# Patient Record
Sex: Female | Born: 1965 | Race: White | Hispanic: No | Marital: Married | Smoking: Never smoker
Health system: Southern US, Community
[De-identification: ages and names within clinical notes are randomized; demographics above are authoritative.]

## PROBLEM LIST (undated history)

## (undated) DIAGNOSIS — K219 Gastro-esophageal reflux disease without esophagitis: Secondary | ICD-10-CM

## (undated) DIAGNOSIS — R51 Headache: Secondary | ICD-10-CM

## (undated) DIAGNOSIS — G4730 Sleep apnea, unspecified: Secondary | ICD-10-CM

## (undated) DIAGNOSIS — R519 Headache, unspecified: Secondary | ICD-10-CM

## (undated) HISTORY — PX: DIAGNOSTIC LAPAROSCOPY: SUR761

## (undated) HISTORY — PX: COLONOSCOPY: SHX174

## (undated) HISTORY — PX: ABDOMINAL HYSTERECTOMY: SHX81

---

## 1997-11-03 ENCOUNTER — Inpatient Hospital Stay (HOSPITAL_COMMUNITY): Admission: AD | Admit: 1997-11-03 | Discharge: 1997-11-03 | Payer: Self-pay | Admitting: Obstetrics and Gynecology

## 1998-02-25 ENCOUNTER — Inpatient Hospital Stay (HOSPITAL_COMMUNITY): Admission: AD | Admit: 1998-02-25 | Discharge: 1998-02-27 | Payer: Self-pay | Admitting: Obstetrics & Gynecology

## 1999-04-27 ENCOUNTER — Other Ambulatory Visit: Admission: RE | Admit: 1999-04-27 | Discharge: 1999-04-27 | Payer: Self-pay | Admitting: Obstetrics & Gynecology

## 2000-05-31 ENCOUNTER — Other Ambulatory Visit: Admission: RE | Admit: 2000-05-31 | Discharge: 2000-05-31 | Payer: Self-pay | Admitting: Obstetrics & Gynecology

## 2001-06-26 ENCOUNTER — Other Ambulatory Visit: Admission: RE | Admit: 2001-06-26 | Discharge: 2001-06-26 | Payer: Self-pay | Admitting: Obstetrics & Gynecology

## 2001-09-12 ENCOUNTER — Encounter: Admission: RE | Admit: 2001-09-12 | Discharge: 2001-09-12 | Payer: Self-pay | Admitting: Obstetrics & Gynecology

## 2001-09-12 ENCOUNTER — Encounter: Payer: Self-pay | Admitting: Obstetrics & Gynecology

## 2001-09-29 ENCOUNTER — Encounter: Admission: RE | Admit: 2001-09-29 | Discharge: 2001-12-28 | Payer: Self-pay | Admitting: Family Medicine

## 2002-08-03 ENCOUNTER — Other Ambulatory Visit: Admission: RE | Admit: 2002-08-03 | Discharge: 2002-08-03 | Payer: Self-pay | Admitting: Obstetrics & Gynecology

## 2002-08-14 ENCOUNTER — Encounter: Admission: RE | Admit: 2002-08-14 | Discharge: 2002-08-14 | Payer: Self-pay | Admitting: Family Medicine

## 2002-08-14 ENCOUNTER — Encounter: Payer: Self-pay | Admitting: Family Medicine

## 2003-08-19 ENCOUNTER — Other Ambulatory Visit: Admission: RE | Admit: 2003-08-19 | Discharge: 2003-08-19 | Payer: Self-pay | Admitting: Obstetrics & Gynecology

## 2004-02-19 ENCOUNTER — Encounter: Admission: RE | Admit: 2004-02-19 | Discharge: 2004-02-19 | Payer: Self-pay | Admitting: Gastroenterology

## 2004-09-13 HISTORY — PX: SHOULDER SURGERY: SHX246

## 2004-09-29 ENCOUNTER — Other Ambulatory Visit: Admission: RE | Admit: 2004-09-29 | Discharge: 2004-09-29 | Payer: Self-pay | Admitting: Obstetrics & Gynecology

## 2005-10-26 ENCOUNTER — Other Ambulatory Visit: Admission: RE | Admit: 2005-10-26 | Discharge: 2005-10-26 | Payer: Self-pay | Admitting: Obstetrics & Gynecology

## 2006-07-17 ENCOUNTER — Encounter: Admission: RE | Admit: 2006-07-17 | Discharge: 2006-07-17 | Payer: Self-pay | Admitting: Family Medicine

## 2007-05-25 ENCOUNTER — Ambulatory Visit (HOSPITAL_COMMUNITY): Admission: RE | Admit: 2007-05-25 | Discharge: 2007-05-26 | Payer: Self-pay | Admitting: Obstetrics & Gynecology

## 2007-05-25 ENCOUNTER — Encounter (INDEPENDENT_AMBULATORY_CARE_PROVIDER_SITE_OTHER): Payer: Self-pay | Admitting: Obstetrics & Gynecology

## 2007-07-16 ENCOUNTER — Encounter: Admission: RE | Admit: 2007-07-16 | Discharge: 2007-07-16 | Payer: Self-pay | Admitting: Neurosurgery

## 2007-11-11 ENCOUNTER — Encounter: Admission: RE | Admit: 2007-11-11 | Discharge: 2007-11-11 | Payer: Self-pay | Admitting: Neurosurgery

## 2008-06-20 ENCOUNTER — Encounter: Admission: RE | Admit: 2008-06-20 | Discharge: 2008-06-20 | Payer: Self-pay | Admitting: Gastroenterology

## 2008-08-20 ENCOUNTER — Encounter: Admission: RE | Admit: 2008-08-20 | Discharge: 2008-08-20 | Payer: Self-pay | Admitting: Gastroenterology

## 2010-10-04 ENCOUNTER — Encounter: Payer: Self-pay | Admitting: Gastroenterology

## 2010-10-14 ENCOUNTER — Encounter: Payer: Self-pay | Admitting: Orthopedic Surgery

## 2011-01-26 NOTE — Op Note (Signed)
Nicole [name], Nicole [name] NO.:  [id number]   MEDICAL RECORD NO.:  [id number]          PATIENT TYPE:  AMB   LOCATION:  SDC                           FACILITY:  [facility]   PHYSICIAN:  Freddy Finner, M.D.   DATE OF BIRTH:  Nov 10, 1965   DATE OF PROCEDURE:  05/25/2007  DATE OF DISCHARGE:                               OPERATIVE REPORT   PREOPERATIVE DIAGNOSES:  1. Uterine fibroids.  2. Complex left ovarian cysts.  3. Left paratubal cyst.   POSTOPERATIVE DIAGNOSES:  1. Uterine fibroids.  2. Complex left ovarian cysts.  3. Left paratubal cyst.  4. Benign-appearing cystic left ovary.   PROCEDURE:  Laparoscopically-assisted vaginal hysterectomy, left  salpingo-oophorectomy.   SURGEON:  Varney Baas, MD.   Threasa HeadsMilton Ferguson.   ANESTHESIA:  General.   ESTIMATED BLOOD LOSS:  Estimated intraoperative blood loss 150 mL.   INTRAOPERATIVE COMPLICATIONS:  None.   HISTORY OF PRESENT ILLNESS:  Details of the present illness are recorded  in the admission note. The patient was admitted on the morning of  surgery.   PROCEDURE IN DETAIL:  She was brought to the operating room after  receiving a gram of Ancef IV and being placed in serial compression hose  on her lower extremities. She was placed under adequate general  endotracheal anesthesia, placed in the dorsal lithotomy position using  the [employer] system. Betadine prep of the abdomen, perineum and  vagina was carried out in the standard fashion. Bladder was evacuated  with sterile technique using a Foley catheter.   Hulka tenaculum was attached to the cervix under direct visualization.  Sterile drapes were applied. Two small incisions were made, one at the  umbilicus, one just above the symphysis. An 11-mm blade and disposable  trocar were introduced in the umbilicus while elevating the anterior  abdominal wall manually. Direct inspection revealed adequate placement  with no evidence of injury on entry.  Pneumoperitoneum was allowed to  accumulate with carbon dioxide gas. A 5-mm trocar was placed through a  lower incision and placed just above the symphysis in the midline.  Through it a spring-loaded grasping forceps was used.   Systematic examination of the pelvic and abdominal contents was carried  out; photographs were made. The liver appeared to be normal, appendix  was normal. No apparent abnormalities were noted in the upper abdomen.  The uterus was enlarged. Right tube and ovary were normal. Left tube was  normal except there was a paratubal 1 to 1.5 cm cyst. The left ovary was  cystically enlarged but there were no external excrescences. There was  no fixation, no pelvic adhesions of any kind.   Using the Gyrus Tripolar device, the dissection was begun. The right  utero-ovarian ligament was sealed and divided. The fallopian tube was  sealed and divided. The round ligament was sealed and divided. The upper  broad ligament was sealed and divided. This was carried down to a level  just above the uterine arteries. On the left side the infundibulopelvic  ligament was developed with the same technique with progressive bites,  as well as the round ligament and upper broad ligament. Again, the  dissection was carried down to a level just above the uterine arteries.   Attention was then turned vaginally. Posterior weighted vaginal  retractor was placed. A Hulka tenaculum was replaced with a Jacobs  tenaculum. Deaver retractors were used anteriorly and laterally for  exposure. The mucosa posterior to the cervix was tented and the cul-de-  sac entered with sharp dissection with Mayo scissors. The cervix was  circumscribed with a scalpel to releas the mucosa. Uterosacral pedicles  were then sealed and divided with a LigaSure device. Bladder pillars  were taken separately, sealed and divided. The bladder was carefully  advanced off the cervix and lower segment. Anterior peritoneum was   entered without difficulty. LigaSure was then used to seal and divide  the cardinal ligament pedicles, the uterine artery pedicles and the  pedicle above the uterine arteries on each side. The uterus was  delivered through the introitus. Remaining peritoneal band on the right  side was then sealed and divided. Moist tapes were placed to pack the  bowel away from the cuff. The anchors of the vagina were anchored to the  uterosacrals with a mattress suture of 0 Monocryl. Uterosacrals were  plicated and posterior peritoneum closed with an interrupted 0 Monocryl  suture. Cuff was closed vertically with figure-of-eights of 0 Monocryl.   Foley catheter was placed. Reinspection abdominally was then carried  out. Then Nezhat irrigation system was used and irrigation of the pelvis  was carried out. Hemostasis was complete. This was confirmed under  irrigation and under reduced intra-abdominal pressure. The irrigating  solution was aspirated from the abdomen , gas was allowed to escape from  the abdomen, instruments were removed.   The skin incisions were anesthetized with quarter-percent plain  Marcaine. Incisions were closed with interrupted subcuticular sutures of  3-0 Dexon. Steri-Strips were applied to the lower incision. Sterile  dressing was placed at the umbilicus.   The patient was awakened, taken to recovery in good condition.      Freddy Finner, M.D.  Electronically Signed     WRN/MEDQ  D:  05/25/2007  T:  05/25/2007  Job:  [id number]

## 2011-01-26 NOTE — H&P (Signed)
Nicole [name], SEEL NO.:  [id number]   MEDICAL RECORD NO.:  [id number]          PATIENT TYPE:  AMB   LOCATION:  SDC                           FACILITY:  [facility]   PHYSICIAN:  Freddy Finner, M.D.   DATE OF BIRTH:  Jul 20, 1966   DATE OF ADMISSION:  DATE OF DISCHARGE:                              HISTORY & PHYSICAL   ADMITTING DIAGNOSES:  1. Persistent left adnexal mass.  2. Uterine leiomyomata.  3. Chronic pelvic pain, particularly left pelvic pain.  4. Dysfunctional uterine bleeding.   HISTORY OF PRESENT ILLNESS:  The patient is a 45 year old white married  female, gravida 2, para 2 who has had an ongoing problem with  dysfunctional uterine bleeding at least over the last 6 months. On  serial ultrasounds she has been shown to have a persistent cystic left  adnexal mass, possibly consistent with an endometrioma as well as a left  paraovarian mass which is cystic. She does have intramural leiomyomata.  She has used various modalities to control dysfunctional bleeding and  pain including oral contraceptives which she did not tolerate well and  an intrauterine device which also was not tolerated well and really  never produced adequate control of bleeding. Options of therapy have  been discussed with her and she has elected to proceed with a  laparoscopically-assisted vaginal hysterectomy and probably left  salpingo-oophorectomy.   REVIEW OF SYSTEMS:  Her current review of systems is otherwise negative.  There are no cardiopulmonary, GI or other GU conditions or symptoms.   PAST MEDICAL HISTORY:  The patient has no known significant medical  illnesses except adult-onset mild asthma which she treats with oral  inhalers before exercising immunity. She has some GERD symptoms for  which she takes Nexium. She has had 2 uncomplicated vaginal deliveries.   ALLERGIES:  SHE HAS NO KNOWN DRUG ALLERGIES.   SOCIAL HISTORY:  She is not a cigarette smoker.   FAMILY HISTORY:   Noncontributory.   PHYSICAL EXAMINATION:  HEENT: Grossly within normal limits.  NECK: The thyroid gland is not palpably enlarged.  VITAL SIGNS: Blood pressure in the office 102/68.  CHEST: The chest is clear to auscultation.  HEART: Normal sinus rhythm. There is a grade 2/6 early systolic murmur  most consistent with a flay murmur.  BREAST: Exam is considered to be normal. No nipple change, no skin  change, no palpable masses, no nipple discharge. (Mammogram in April of  2008, was normal).  ABDOMEN: The abdomen is soft. There is mild left lower abdominal  tenderness. No appreciable hepatosplenomegaly. No CVA tenderness.  PELVIC: On exam, external genitalia, vagina and cervix are normal.  Bimanual reveals tenderness in the left adnexa. The uterus is slightly  enlarged. There is a left adnexal fullness but no definite palpable mass  clinically.  RECTUM: The rectum is palpably normal. Rectovaginal exam confirmed these  findings.  EXTREMITIES: Without clubbing, cyanosis or edema.   ASSESSMENT:  Uterine leiomyomata, persistent left ovarian mass most  consistent with an endometrioma, dysfunctional uterine bleeding, chronic  left pelvic pain.   PLAN:  Laparoscopically-assisted  vaginal hysterectomy, left salpingo-  oophorectomy probable, possible bilateral salpingo-oophorectomy. The  potential risks of the procedure, including infection, injury to other  organs, hemorrhage have been discussed with the patient. She is now  admitted and prepared to proceed with surgery.      Freddy Finner, M.D.  Electronically Signed     WRN/MEDQ  D:  05/24/2007  T:  05/24/2007  Job:  [id number]

## 2011-01-29 NOTE — Discharge Summary (Signed)
NAMECLESSIE, Nicole [name] NO.:  [id number]   MEDICAL RECORD NO.:  [id number]          PATIENT TYPE:  OIB   LOCATION:  9313                          FACILITY:  [facility]   PHYSICIAN:  Freddy Finner, M.D.   DATE OF BIRTH:  Feb 17, 1966   DATE OF ADMISSION:  05/25/2007  DATE OF DISCHARGE:  05/26/2007                               DISCHARGE SUMMARY   DISCHARGE DIAGNOSES:  1. Chronic left pelvic pain.  2. Uterine leiomyomata.  3. Persistent left adnexal mass.  4. Dysfunctional uterine bleeding.   OPERATIVE PROCEDURES:  1. Laparoscopically assisted vaginal hysterectomy.  2. Left salpingo-oophorectomy.   HISTOLOGIC DIAGNOSES:  1. Uterine leiomyomata.  2. Benign left paratubal cyst.   DISPOSITION:  The patient was in satisfactory improved condition at that  time of her discharge.  She was having adequate bowel and bladder  function.  She was ambulating without difficulty.  She was discharged  home on Percocet 10/500 one every 4-6 hours as needed for postoperative  pain.  She is to continue multivitamins.  She is to use ibuprofen.  She  is to avoid vaginal entry.  She is to call for fever or heavy vaginal  bleeding.  She is return to the office in approximately 2 weeks for  first postoperative visit.  She is to take a regular diet.   Details of present illness, past history, family history, review of  systems and physical exam recorded in the admission note.   The physical findings on admission of note are enlargement of the uterus  and ultrasound evidence of persistent cystic left adnexal mass.   LABORATORY DATA DURING THIS ADMISSION:  Includes normal CBC on admission  with hemoglobin 14.1, postoperative hemoglobin was 10.5.  admission  prothrombin time and PTT were normal.   HOSPITAL COURSE:  The patient was admitted on the morning of surgery.  She was treated perioperatively with serial compression hose for her  lower extremities.  She was given a bolus of Ancef  preoperatively.  The  above-described surgical procedure was accomplished on the morning of  admission without difficulty or intraoperative  complications.  By the first postoperative day, she had remained  afebrile.  Her condition was considered to be satisfactory.  Her  abdominal incisions were clean, dry and intact.  She was having scant  vaginal drainage.  She was considered to be in good condition and was  discharged home with further disposition as noted above.      Freddy Finner, M.D.  Electronically Signed     WRN/MEDQ  D:  06/22/2007  T:  06/22/2007  Job:  [id number]

## 2011-03-11 ENCOUNTER — Other Ambulatory Visit: Payer: Self-pay | Admitting: Obstetrics & Gynecology

## 2011-03-11 DIAGNOSIS — Z803 Family history of malignant neoplasm of breast: Secondary | ICD-10-CM

## 2011-03-30 ENCOUNTER — Other Ambulatory Visit: Payer: Self-pay

## 2011-06-25 LAB — CBC
HCT: 29.8 — ABNORMAL LOW
HCT: 40.3
Hemoglobin: 10.5 — ABNORMAL LOW
Hemoglobin: 14.1
MCHC: 35
MCHC: 35.1
MCV: 90.6
MCV: 91.2
Platelets: 206
Platelets: 271
RBC: 3.27 — ABNORMAL LOW
RBC: 4.46
RDW: 11.5
RDW: 11.9
WBC: 6.3
WBC: 8.6

## 2011-06-25 LAB — APTT: aPTT: 26

## 2011-06-25 LAB — PROTIME-INR
INR: 1
Prothrombin Time: 12.9

## 2012-05-26 ENCOUNTER — Other Ambulatory Visit: Payer: Self-pay | Admitting: Dermatology

## 2012-10-02 ENCOUNTER — Other Ambulatory Visit: Payer: Self-pay | Admitting: Neurosurgery

## 2012-10-02 DIAGNOSIS — M542 Cervicalgia: Secondary | ICD-10-CM

## 2012-10-03 ENCOUNTER — Ambulatory Visit
Admission: RE | Admit: 2012-10-03 | Discharge: 2012-10-03 | Disposition: A | Discharge status: Home / Self Care | Payer: BC Managed Care – PPO | Source: Ambulatory Visit | Attending: Neurosurgery | Admitting: Neurosurgery

## 2012-10-03 DIAGNOSIS — M542 Cervicalgia: Secondary | ICD-10-CM

## 2013-05-10 ENCOUNTER — Encounter (HOSPITAL_COMMUNITY): Payer: Self-pay | Admitting: Emergency Medicine

## 2013-05-10 ENCOUNTER — Emergency Department (HOSPITAL_COMMUNITY)
Admission: EM | Admit: 2013-05-10 | Discharge: 2013-05-10 | Disposition: A | Discharge status: Home / Self Care | Payer: BC Managed Care – PPO | Source: Home / Self Care

## 2013-05-10 DIAGNOSIS — T63461A Toxic effect of venom of wasps, accidental (unintentional), initial encounter: Secondary | ICD-10-CM

## 2013-05-10 DIAGNOSIS — T6391XA Toxic effect of contact with unspecified venomous animal, accidental (unintentional), initial encounter: Secondary | ICD-10-CM

## 2013-05-10 MED ORDER — TRIAMCINOLONE ACETONIDE 40 MG/ML IJ SUSP
INTRAMUSCULAR | Status: AC
  Filled 2013-05-10: qty 1

## 2013-05-10 MED ORDER — TRIAMCINOLONE ACETONIDE 40 MG/ML IJ SUSP
40.0000 mg | Freq: Once | INTRAMUSCULAR | Status: AC
  Administered 2013-05-10: 40 mg via INTRAMUSCULAR

## 2013-05-10 NOTE — ED Notes (Signed)
C/o bee sting and having an allergic reaction.  Patient states she gets the red streaks that spread out with swelling.

## 2013-05-10 NOTE — ED Provider Notes (Signed)
CSN: [id number]     Arrival date & time 05/10/13  1102 History   First MD Initiated Contact with Patient 05/10/13 1137     Chief Complaint  Patient presents with  . Insect Bite   (Consider location/radiation/quality/duration/timing/severity/associated sxs/prior Treatment) HPI Comments: 47 year old female states she was stung by a bee to the medial aspect of her right ankle at 8 PM last night. This produced approximately 4 cm area of minor erythema surrounding a pinpoint center created by the sting. Denies much itching or pain. She states every time she gets stains approximately 24 hours later she develops a reaction that is described by red streaks ascending the extremity. She is requesting a prednisone shot. Denies systemic symptoms.   History reviewed. No pertinent past medical history. No past surgical history on file. No family history on file. History  Substance Use Topics  . Smoking status: Not on file  . Smokeless tobacco: Not on file  . Alcohol Use: Not on file   OB History   Grav Para Term Preterm Abortions TAB SAB Ect Mult Living                 Review of Systems  Skin:       As per history of present illness  All other systems reviewed and are negative.    Allergies  Bee venom  Home Medications  No current outpatient prescriptions on file. BP 110/73  Pulse 66  Temp(Src) 98.2 F (36.8 C) (Oral)  Resp 16  SpO2 98% Physical Exam  Nursing note and vitals reviewed. Constitutional: She is oriented to person, place, and time. She appears well-developed and well-nourished. No distress.  HENT:  Mouth/Throat: Oropharynx is clear and moist. No oropharyngeal exudate.  Eyes: Conjunctivae and EOM are normal.  Neck: Normal range of motion. Neck supple.  Cardiovascular: Normal rate, regular rhythm and normal heart sounds.   Pulmonary/Chest: Effort normal and breath sounds normal. No respiratory distress. She has no wheezes. She has no rales.  Musculoskeletal: She  exhibits no edema.  Lymphadenopathy:    She has no cervical adenopathy.  Neurological: She is alert and oriented to person, place, and time. She exhibits normal muscle tone.  Skin: Skin is warm and dry.  4-5 cm diameter of light erythema to the medial aspect of the right ankle. No lymphangitis, streaking or associated rash. no edema.   Psychiatric: She has a normal mood and affect.    ED Course  Procedures (including critical care time) Labs Review Labs Reviewed - No data to display Imaging Review No results found.  MDM   1. Bee sting, initial encounter    There is a minimal local skin reaction to a bee sting to the right medial ankle. No systemic symptoms. The patient is requesting a Kenalog injection to ward off any allergic reaction that may occur in 24 hours after the sting. Kenalog 40 mg IM. Instructions for bee stings and allergic reaction.    Hayden Rasmussen, NP 05/10/13 [phone]

## 2013-05-12 NOTE — ED Provider Notes (Signed)
Medical screening examination/treatment/procedure(s) were performed by a resident physician or non-physician practitioner and as the supervising physician I was immediately available for consultation/collaboration.  Anzal Bartnick, MD   Ulani Degrasse S Aryona Sill, MD 05/12/13 0756 

## 2013-11-09 ENCOUNTER — Other Ambulatory Visit: Payer: Self-pay | Admitting: Obstetrics & Gynecology

## 2013-11-09 DIAGNOSIS — N6310 Unspecified lump in the right breast, unspecified quadrant: Secondary | ICD-10-CM

## 2013-11-14 ENCOUNTER — Ambulatory Visit
Admission: RE | Admit: 2013-11-14 | Discharge: 2013-11-14 | Disposition: A | Discharge status: Home / Self Care | Payer: BC Managed Care – PPO | Source: Ambulatory Visit | Attending: Obstetrics & Gynecology | Admitting: Obstetrics & Gynecology

## 2013-11-14 DIAGNOSIS — N6310 Unspecified lump in the right breast, unspecified quadrant: Secondary | ICD-10-CM

## 2013-11-16 ENCOUNTER — Other Ambulatory Visit: Payer: BC Managed Care – PPO

## 2013-11-20 ENCOUNTER — Other Ambulatory Visit: Payer: Self-pay

## 2013-11-20 DIAGNOSIS — Z1231 Encounter for screening mammogram for malignant neoplasm of breast: Secondary | ICD-10-CM

## 2013-11-21 ENCOUNTER — Other Ambulatory Visit: Payer: BC Managed Care – PPO

## 2013-12-20 ENCOUNTER — Other Ambulatory Visit: Payer: Self-pay | Admitting: Obstetrics & Gynecology

## 2013-12-20 ENCOUNTER — Other Ambulatory Visit: Payer: Self-pay

## 2013-12-20 DIAGNOSIS — Z710 Person encountering health services to consult on behalf of another person: Secondary | ICD-10-CM

## 2013-12-27 ENCOUNTER — Ambulatory Visit
Admission: RE | Admit: 2013-12-27 | Discharge: 2013-12-27 | Disposition: A | Discharge status: Home / Self Care | Payer: BC Managed Care – PPO | Source: Ambulatory Visit

## 2013-12-27 DIAGNOSIS — Z1231 Encounter for screening mammogram for malignant neoplasm of breast: Secondary | ICD-10-CM

## 2013-12-28 ENCOUNTER — Ambulatory Visit: Payer: BC Managed Care – PPO

## 2014-01-02 ENCOUNTER — Other Ambulatory Visit: Payer: Self-pay | Admitting: Obstetrics & Gynecology

## 2014-01-02 DIAGNOSIS — R928 Other abnormal and inconclusive findings on diagnostic imaging of breast: Secondary | ICD-10-CM

## 2014-01-03 ENCOUNTER — Encounter (INDEPENDENT_AMBULATORY_CARE_PROVIDER_SITE_OTHER): Payer: Self-pay

## 2014-01-03 ENCOUNTER — Ambulatory Visit
Admission: RE | Admit: 2014-01-03 | Discharge: 2014-01-03 | Disposition: A | Discharge status: Home / Self Care | Payer: BC Managed Care – PPO | Source: Ambulatory Visit | Attending: Obstetrics & Gynecology | Admitting: Obstetrics & Gynecology

## 2014-01-03 DIAGNOSIS — R928 Other abnormal and inconclusive findings on diagnostic imaging of breast: Secondary | ICD-10-CM

## 2014-01-07 ENCOUNTER — Other Ambulatory Visit: Payer: Self-pay | Admitting: Obstetrics & Gynecology

## 2014-01-07 DIAGNOSIS — Z803 Family history of malignant neoplasm of breast: Secondary | ICD-10-CM

## 2014-01-07 DIAGNOSIS — R9230 Dense breasts, unspecified: Secondary | ICD-10-CM

## 2014-01-07 DIAGNOSIS — R922 Inconclusive mammogram: Secondary | ICD-10-CM

## 2014-01-10 ENCOUNTER — Other Ambulatory Visit: Payer: BC Managed Care – PPO

## 2014-01-10 ENCOUNTER — Ambulatory Visit
Admission: RE | Admit: 2014-01-10 | Discharge: 2014-01-10 | Disposition: A | Discharge status: Home / Self Care | Payer: BC Managed Care – PPO | Source: Ambulatory Visit | Attending: Obstetrics & Gynecology | Admitting: Obstetrics & Gynecology

## 2014-01-10 DIAGNOSIS — R922 Inconclusive mammogram: Secondary | ICD-10-CM

## 2014-01-10 DIAGNOSIS — Z803 Family history of malignant neoplasm of breast: Secondary | ICD-10-CM

## 2014-01-10 MED ORDER — GADOBENATE DIMEGLUMINE 529 MG/ML IV SOLN
15.0000 mL | Freq: Once | INTRAVENOUS | Status: AC | PRN
  Administered 2014-01-10: 15 mL via INTRAVENOUS

## 2014-01-18 ENCOUNTER — Other Ambulatory Visit: Payer: Self-pay | Admitting: Obstetrics & Gynecology

## 2014-01-18 DIAGNOSIS — Z710 Person encountering health services to consult on behalf of another person: Secondary | ICD-10-CM

## 2014-01-23 ENCOUNTER — Ambulatory Visit
Admission: RE | Admit: 2014-01-23 | Discharge: 2014-01-23 | Disposition: A | Discharge status: Home / Self Care | Payer: BC Managed Care – PPO | Source: Ambulatory Visit

## 2014-01-23 DIAGNOSIS — Z710 Person encountering health services to consult on behalf of another person: Secondary | ICD-10-CM

## 2014-06-05 ENCOUNTER — Other Ambulatory Visit: Payer: Self-pay | Admitting: Obstetrics & Gynecology

## 2014-06-05 DIAGNOSIS — N6320 Unspecified lump in the left breast, unspecified quadrant: Secondary | ICD-10-CM

## 2014-06-20 ENCOUNTER — Ambulatory Visit
Admission: RE | Admit: 2014-06-20 | Discharge: 2014-06-20 | Disposition: A | Discharge status: Home / Self Care | Payer: BC Managed Care – PPO | Source: Ambulatory Visit | Attending: Obstetrics & Gynecology | Admitting: Obstetrics & Gynecology

## 2014-06-20 DIAGNOSIS — N6320 Unspecified lump in the left breast, unspecified quadrant: Secondary | ICD-10-CM

## 2014-11-20 ENCOUNTER — Other Ambulatory Visit: Payer: Self-pay | Admitting: Obstetrics & Gynecology

## 2014-11-20 DIAGNOSIS — N649 Disorder of breast, unspecified: Secondary | ICD-10-CM

## 2014-12-16 ENCOUNTER — Other Ambulatory Visit: Payer: Self-pay | Admitting: Obstetrics and Gynecology

## 2014-12-17 LAB — CYTOLOGY - PAP

## 2014-12-30 ENCOUNTER — Ambulatory Visit
Admission: RE | Admit: 2014-12-30 | Discharge: 2014-12-30 | Disposition: A | Discharge status: Home / Self Care | Payer: BLUE CROSS/BLUE SHIELD | Source: Ambulatory Visit | Attending: Obstetrics & Gynecology | Admitting: Obstetrics & Gynecology

## 2014-12-30 DIAGNOSIS — N649 Disorder of breast, unspecified: Secondary | ICD-10-CM

## 2015-08-13 ENCOUNTER — Other Ambulatory Visit: Payer: Self-pay | Admitting: Nurse Practitioner

## 2015-08-13 DIAGNOSIS — N644 Mastodynia: Secondary | ICD-10-CM

## 2015-08-14 ENCOUNTER — Ambulatory Visit
Admission: RE | Admit: 2015-08-14 | Discharge: 2015-08-14 | Disposition: A | Discharge status: Home / Self Care | Payer: BLUE CROSS/BLUE SHIELD | Source: Ambulatory Visit | Attending: Nurse Practitioner | Admitting: Nurse Practitioner

## 2015-08-14 ENCOUNTER — Other Ambulatory Visit: Payer: Self-pay | Admitting: Nurse Practitioner

## 2015-08-14 DIAGNOSIS — N644 Mastodynia: Secondary | ICD-10-CM

## 2015-08-19 ENCOUNTER — Other Ambulatory Visit: Payer: BLUE CROSS/BLUE SHIELD

## 2015-11-11 ENCOUNTER — Other Ambulatory Visit: Payer: Self-pay

## 2015-11-11 DIAGNOSIS — Z1231 Encounter for screening mammogram for malignant neoplasm of breast: Secondary | ICD-10-CM

## 2015-12-31 ENCOUNTER — Ambulatory Visit
Admission: RE | Admit: 2015-12-31 | Discharge: 2015-12-31 | Disposition: A | Discharge status: Home / Self Care | Payer: 59 | Source: Ambulatory Visit

## 2015-12-31 DIAGNOSIS — Z1231 Encounter for screening mammogram for malignant neoplasm of breast: Secondary | ICD-10-CM

## 2016-11-18 ENCOUNTER — Other Ambulatory Visit: Payer: Self-pay | Admitting: Obstetrics & Gynecology

## 2016-11-18 DIAGNOSIS — Z01419 Encounter for gynecological examination (general) (routine) without abnormal findings: Secondary | ICD-10-CM | POA: Diagnosis not present

## 2016-11-18 DIAGNOSIS — Z1231 Encounter for screening mammogram for malignant neoplasm of breast: Secondary | ICD-10-CM

## 2016-12-31 ENCOUNTER — Ambulatory Visit: Payer: 59

## 2017-01-10 ENCOUNTER — Ambulatory Visit
Admission: RE | Admit: 2017-01-10 | Discharge: 2017-01-10 | Disposition: A | Discharge status: Home / Self Care | Payer: 59 | Source: Ambulatory Visit | Attending: Obstetrics & Gynecology | Admitting: Obstetrics & Gynecology

## 2017-01-10 DIAGNOSIS — Z1231 Encounter for screening mammogram for malignant neoplasm of breast: Secondary | ICD-10-CM | POA: Diagnosis not present

## 2017-02-17 DIAGNOSIS — Z0000 Encounter for general adult medical examination without abnormal findings: Secondary | ICD-10-CM | POA: Diagnosis not present

## 2017-02-17 DIAGNOSIS — R7301 Impaired fasting glucose: Secondary | ICD-10-CM | POA: Diagnosis not present

## 2017-05-18 DIAGNOSIS — W57XXXA Bitten or stung by nonvenomous insect and other nonvenomous arthropods, initial encounter: Secondary | ICD-10-CM | POA: Diagnosis not present

## 2017-05-18 DIAGNOSIS — S20479A Other superficial bite of unspecified back wall of thorax, initial encounter: Secondary | ICD-10-CM | POA: Diagnosis not present

## 2017-05-18 DIAGNOSIS — Z23 Encounter for immunization: Secondary | ICD-10-CM | POA: Diagnosis not present

## 2017-05-30 DIAGNOSIS — D225 Melanocytic nevi of trunk: Secondary | ICD-10-CM | POA: Diagnosis not present

## 2017-05-30 DIAGNOSIS — L738 Other specified follicular disorders: Secondary | ICD-10-CM | POA: Diagnosis not present

## 2017-05-30 DIAGNOSIS — D1801 Hemangioma of skin and subcutaneous tissue: Secondary | ICD-10-CM | POA: Diagnosis not present

## 2017-11-08 ENCOUNTER — Other Ambulatory Visit: Payer: Self-pay

## 2017-11-08 ENCOUNTER — Encounter: Payer: Self-pay | Admitting: Family Medicine

## 2017-11-08 ENCOUNTER — Ambulatory Visit: Payer: 59 | Admitting: Family Medicine

## 2017-11-08 DIAGNOSIS — M7061 Trochanteric bursitis, right hip: Secondary | ICD-10-CM

## 2017-11-08 DIAGNOSIS — M7062 Trochanteric bursitis, left hip: Secondary | ICD-10-CM | POA: Diagnosis not present

## 2017-11-08 MED ORDER — VITAMIN D (ERGOCALCIFEROL) 1.25 MG (50000 UNIT) PO CAPS: 50000.0000 [IU] | ORAL_CAPSULE | ORAL | 0 refills | Status: DC

## 2017-11-08 MED ORDER — GABAPENTIN 100 MG PO CAPS: 200.0000 mg | ORAL_CAPSULE | Freq: Every day | ORAL | 3 refills | Status: DC

## 2017-11-08 MED ORDER — DICLOFENAC SODIUM 2 % TD SOLN: 2.0000 g | Freq: Two times a day (BID) | TRANSDERMAL | 3 refills | Status: DC

## 2017-11-08 NOTE — Patient Instructions (Addendum)
Good to see you  Ice 20 minutes 2 times daily. Usually after activity and before bed. Exercises 3 times a week.  pennsaid pinkie amount topically 2 times daily as needed.  Gabapentin 200mg  at night  Once weekly vitamin D for 12 weeks.  Try biking a little more then running for now.  [employer] orthotics "total support" online would be great  Brooks running shoes  See me again in 4 weeks.

## 2017-11-08 NOTE — Progress Notes (Signed)
  [name] [address] [place], [place] [address] Phone: [phone] Subjective:    I'm seeing this patient by the request  of:    CC:   [id number]  Nicole [name] is a 52 y.o. female coming in with complaint of left hip pain. Right IT band is worse than left. Uses a stand up desk (3 years).  Onset- Chronic Location- IT band  Duration- Night Character- Achy Aggravating factors- laying on her side, massage, foam rolling Reliving factors-  Therapies tried- Massage Severity-     No past medical history on file. No past surgical history on file. Social History   Socioeconomic History  . Marital status: Married    Spouse name: Not on file  . Number of children: Not on file  . Years of education: Not on file  . Highest education level: Not on file  Social Needs  . Financial resource strain: Not on file  . Food insecurity - worry: Not on file  . Food insecurity - inability: Not on file  . Transportation needs - medical: Not on file  . Transportation needs - non-medical: Not on file  Occupational History  . Not on file  Tobacco Use  . Smoking status: Not on file  Substance and Sexual Activity  . Alcohol use: Not on file  . Drug use: Not on file  . Sexual activity: Not on file  Other Topics Concern  . Not on file  Social History Narrative  . Not on file   Allergies  Allergen Reactions  . Bee Venom    Family History  Problem Relation Age of Onset  . Breast cancer Mother 64     Past medical history, social, surgical and family history all reviewed in electronic medical record.  No pertanent information unless stated regarding to the chief complaint.   Review of Systems:Review of systems updated and as accurate as of 11/08/17  No headache, visual changes, nausea, vomiting, diarrhea, constipation, dizziness, abdominal pain, skin rash, fevers, chills, night sweats, weight loss, swollen lymph nodes, body aches, joint swelling,  muscle aches, chest pain, shortness of breath, mood changes.   Objective  There were no vitals taken for this visit. Systems examined below as of 11/08/17   General: No apparent distress alert and oriented x3 mood and affect normal, dressed appropriately.  HEENT: Pupils equal, extraocular movements intact  Respiratory: Patient's speak in full sentences and does not appear short of breath  Cardiovascular: No lower extremity edema, non tender, no erythema  Skin: Warm dry intact with no signs of infection or rash on extremities or on axial skeleton.  Abdomen: Soft nontender  Neuro: Cranial nerves II through XII are intact, neurovascularly intact in all extremities with 2+ DTRs and 2+ pulses.  Lymph: No lymphadenopathy of posterior or anterior cervical chain or axillae bilaterally.  Gait normal with good balance and coordination.  MSK:  Non tender with full range of motion and good stability and symmetric strength and tone of shoulders, elbows, wrist, hip, knee and ankles bilaterally.     Impression and Recommendations:     This case required medical decision making of moderate complexity.      Note: This dictation was prepared with Dragon dictation along with smaller phrase technology. Any transcriptional errors that result from this process are unintentional.

## 2017-11-08 NOTE — Assessment & Plan Note (Signed)
Greater trochanteric bursitis bilaterally.  Differential includes a lumbar pathology.  X-rays ordered today.  Discussed icing regimen and home exercises, topical anti-inflammatories given.  Once weekly vitamin D for muscle strength and endurance.  Patient work with Product/process development scientist to learn home exercises in greater detail today.  Follow-up again in 4 weeks.  Worsening symptoms consider physical therapy and injection

## 2017-11-08 NOTE — Progress Notes (Signed)
[name] [place], [name] [address] Phone: [phone] Subjective:    CC: Bilateral hip pain follow-up  [id number]  [name] is a 52 y.o. female coming in with complaint of bilateral hip pain.  Has had this for quite some time.  Describes it as a dull, throbbing aching sensation.  Started on the right and then seemed to the left.  Mild association with back pain.  Seem to be worse with standing and then seem to be worse now with sitting.  Patient states that it can wake her up at night.  Denies any radiation past the knee.       No past medical history on file. No past surgical history on file. Social History   Socioeconomic History  . Marital status: Married    Spouse name: None  . Number of children: None  . Years of education: None  . Highest education level: None  Social Needs  . Financial resource strain: None  . Food insecurity - worry: None  . Food insecurity - inability: None  . Transportation needs - medical: None  . Transportation needs - non-medical: None  Occupational History  . None  Tobacco Use  . Smoking status: Never Smoker  . Smokeless tobacco: Never Used  Substance and Sexual Activity  . Alcohol use: None  . Drug use: None  . Sexual activity: None  Other Topics Concern  . None  Social History Narrative  . None   Allergies  Allergen Reactions  . Bee Venom    Family History  Problem Relation Age of Onset  . Breast cancer Mother 78     Past medical history, social, surgical and family history all reviewed in electronic medical record.  No pertanent information unless stated regarding to the chief complaint.   Review of Systems:Review of systems updated and as accurate as of 11/08/17  No headache, visual changes, nausea, vomiting, diarrhea, constipation, dizziness, abdominal pain, skin rash, fevers, chills, night sweats, weight loss, swollen lymph nodes, body aches, joint swelling, muscle  aches, chest pain, shortness of breath, mood changes.   Objective  Blood pressure 100/70, pulse (!) 57, height 5\' 2"  (1.575 m), weight 169 lb (76.7 kg), SpO2 99 %. Systems examined below as of 11/08/17   General: No apparent distress alert and oriented x3 mood and affect normal, dressed appropriately.  HEENT: Pupils equal, extraocular movements intact  Respiratory: Patient's speak in full sentences and does not appear short of breath  Cardiovascular: No lower extremity edema, non tender, no erythema  Skin: Warm dry intact with no signs of infection or rash on extremities or on axial skeleton.  Abdomen: Soft nontender  Neuro: Cranial nerves II through XII are intact, neurovascularly intact in all extremities with 2+ DTRs and 2+ pulses.  Lymph: No lymphadenopathy of posterior or anterior cervical chain or axillae bilaterally.  Gait normal with good balance and coordination.  MSK:  Non tender with full range of motion and good stability and symmetric strength and tone of shoulders, elbows, wrist, knee and ankles bilaterally.  Bilateral hip exam shows the patient does have some mild tightness with Faber bilaterally with pain on platelet lateral aspect bilaterally severely.  Mild pain over the right sacroiliac joint.  Negative straight leg test bilaterally.  No pain with internal rotation and no groin pain.  Neurovascularly intact distally.  97110; 15 additional minutes spent for Therapeutic exercises as stated in above notes.  This included exercises focusing on  stretching, strengthening, with significant focus on eccentric aspects.   Long term goals include an improvement in range of motion, strength, endurance as well as avoiding reinjury. Patient's frequency would include in 1-2 times a day, 3-5 times a week for a duration of 6-12 weeks.Hip strengthening exercises which included:  Pelvic tilt/bracing to help with proper recruitment of the lower abs and pelvic floor muscles  Glute strengthening to  properly contract glutes without over-engaging low back and hamstrings - prone hip extension and glute bridge exercises Proper stretching techniques to increase effectiveness for the hip flexors, groin, quads, piriformic and low back when appropriate     Proper technique shown and discussed handout in great detail with ATC.  All questions were discussed and answered.       Impression and Recommendations:     This case required medical decision making of moderate complexity.      Note: This dictation was prepared with Dragon dictation along with smaller phrase technology. Any transcriptional errors that result from this process are unintentional.

## 2017-11-23 DIAGNOSIS — Z6830 Body mass index (BMI) 30.0-30.9, adult: Secondary | ICD-10-CM | POA: Diagnosis not present

## 2017-11-23 DIAGNOSIS — Z01419 Encounter for gynecological examination (general) (routine) without abnormal findings: Secondary | ICD-10-CM | POA: Diagnosis not present

## 2017-12-06 ENCOUNTER — Ambulatory Visit: Payer: Self-pay

## 2017-12-06 ENCOUNTER — Ambulatory Visit (INDEPENDENT_AMBULATORY_CARE_PROVIDER_SITE_OTHER): Payer: 59 | Admitting: Family Medicine

## 2017-12-06 ENCOUNTER — Encounter: Payer: Self-pay | Admitting: Family Medicine

## 2017-12-06 VITALS — BP 114/80 | HR 50 | Ht 62.0 in | Wt 158.0 lb

## 2017-12-06 DIAGNOSIS — M7061 Trochanteric bursitis, right hip: Secondary | ICD-10-CM | POA: Diagnosis not present

## 2017-12-06 DIAGNOSIS — M25559 Pain in unspecified hip: Secondary | ICD-10-CM | POA: Diagnosis not present

## 2017-12-06 DIAGNOSIS — M7062 Trochanteric bursitis, left hip: Secondary | ICD-10-CM | POA: Diagnosis not present

## 2017-12-06 NOTE — Progress Notes (Signed)
[name] [place], [place] [address] Phone: [phone] Subjective:    I'm seeing this patient by the request  of:    CC: Bilateral hip pain follow-up  HUD:[id number]  Nicole [name] is a 52 y.o. female coming in with complaint of bilateral hip pain.  Patient was seen previously diagnosed with more of a bilateral greater trochanteric bursitis.  Patient will try conservative therapy including gabapentin, topical anti-inflammatories and vitamin D.  Feels that the right one has improved 90% but continues to have pain on the left.  Continues to wake her up at night.  Radiating a little bit down her leg.  Seems to be having some left knee pain.     No past medical history on file. No past surgical history on file. Social History   Socioeconomic History  . Marital status: Married    Spouse name: Not on file  . Number of children: Not on file  . Years of education: Not on file  . Highest education level: Not on file  Occupational History  . Not on file  Social Needs  . Financial resource strain: Not on file  . Food insecurity:    Worry: Not on file    Inability: Not on file  . Transportation needs:    Medical: Not on file    Non-medical: Not on file  Tobacco Use  . Smoking status: Never Smoker  . Smokeless tobacco: Never Used  Substance and Sexual Activity  . Alcohol use: Not on file  . Drug use: Not on file  . Sexual activity: Not on file  Lifestyle  . Physical activity:    Days per week: Not on file    Minutes per session: Not on file  . Stress: Not on file  Relationships  . Social connections:    Talks on phone: Not on file    Gets together: Not on file    Attends religious service: Not on file    Active member of club or organization: Not on file    Attends meetings of clubs or organizations: Not on file    Relationship status: Not on file  Other Topics Concern  . Not on file  Social History Narrative  . Not on file     Allergies  Allergen Reactions  . Bee Venom    Family History  Problem Relation Age of Onset  . Breast cancer Mother 51     Past medical history, social, surgical and family history all reviewed in electronic medical record.  No pertanent information unless stated regarding to the chief complaint.   Review of Systems:Review of systems updated and as accurate as of 12/06/17  No headache, visual changes, nausea, vomiting, diarrhea, constipation, dizziness, abdominal pain, skin rash, fevers, chills, night sweats, weight loss, swollen lymph nodes, body aches, joint swelling, muscle aches, chest pain, shortness of breath, mood changes.   Objective  Blood pressure 114/80, pulse (!) 50, height 5\' 2"  (1.575 m), weight 158 lb (71.7 kg), SpO2 98 %. Systems examined below as of 12/06/17   General: No apparent distress alert and oriented x3 mood and affect normal, dressed appropriately.  HEENT: Pupils equal, extraocular movements intact  Respiratory: Patient's speak in full sentences and does not appear short of breath  Cardiovascular: No lower extremity edema, non tender, no erythema  Skin: Warm dry intact with no signs of infection or rash on extremities or on axial skeleton.  Abdomen: Soft nontender  Neuro:  Cranial nerves II through XII are intact, neurovascularly intact in all extremities with 2+ DTRs and 2+ pulses.  Lymph: No lymphadenopathy of posterior or anterior cervical chain or axillae bilaterally.  Gait normal with good balance and coordination.  MSK:  Non tender with full range of motion and good stability and symmetric strength and tone of shoulders, elbows, wrist,  knee and ankles bilaterally.  Left hip exam shows severe tenderness to palpation over the greater trochanteric area.  Full range of motion.  Positive Corky Sox.   Right side no significant tenderness.     Procedure: Real-time Ultrasound Guided Injection of left  greater trochanteric bursitis secondary to patient's body  habitus Device: GE Logiq Q7  Ultrasound guided injection is preferred based studies that show increased duration, increased effect, greater accuracy, decreased procedural pain, increased response rate, and decreased cost with ultrasound guided versus blind injection.  Verbal informed consent obtained.  Time-out conducted.  Noted no overlying erythema, induration, or other signs of local infection.  Skin prepped in a sterile fashion.  Local anesthesia: Topical Ethyl chloride.  With sterile technique and under real time ultrasound guidance:  Greater trochanteric area was visualized and patient's bursa was noted. A 22-gauge 3 inch needle was inserted and 4 cc of 0.5% Marcaine and 1 cc of Kenalog 40 mg/dL was injected. Pictures taken Completed without difficulty  Pain immediately resolved suggesting accurate placement of the medication.  Advised to call if fevers/chills, erythema, induration, drainage, or persistent bleeding.  Images permanently stored and available for review in the ultrasound unit.  Impression: Technically successful ultrasound guided injection.     Impression and Recommendations:     This case required medical decision making of moderate complexity.      Note: This dictation was prepared with Dragon dictation along with smaller phrase technology. Any transcriptional errors that result from this process are unintentional.

## 2017-12-06 NOTE — Assessment & Plan Note (Signed)
Patient given injection and icing regimen.  Hopefully this will be beneficial.  Discussed icing regimen and home exercises.  Patient will come back and see me again in 4 weeks

## 2017-12-06 NOTE — Patient Instructions (Signed)
Good to see you  Ice 20 minutes 2 times daily. Usually after activity and before bed. Continue the exercises 2-3 times a week  pennsaid pinkie amount topically 2 times daily as needed.   New exercises for the knee  Injected the hip today and should help a lot Watch the shoes.  See me again in 4-6 weeks for one more check in.

## 2017-12-08 DIAGNOSIS — N951 Menopausal and female climacteric states: Secondary | ICD-10-CM | POA: Diagnosis not present

## 2017-12-16 ENCOUNTER — Other Ambulatory Visit: Payer: Self-pay | Admitting: Obstetrics & Gynecology

## 2017-12-16 DIAGNOSIS — Z1231 Encounter for screening mammogram for malignant neoplasm of breast: Secondary | ICD-10-CM

## 2018-01-06 ENCOUNTER — Ambulatory Visit
Admission: RE | Admit: 2018-01-06 | Discharge: 2018-01-06 | Disposition: A | Discharge status: Home / Self Care | Payer: 59 | Source: Ambulatory Visit | Attending: Obstetrics & Gynecology | Admitting: Obstetrics & Gynecology

## 2018-01-06 DIAGNOSIS — Z1231 Encounter for screening mammogram for malignant neoplasm of breast: Secondary | ICD-10-CM | POA: Diagnosis not present

## 2018-01-13 ENCOUNTER — Ambulatory Visit: Payer: 59 | Admitting: Family Medicine

## 2018-02-23 DIAGNOSIS — Z0000 Encounter for general adult medical examination without abnormal findings: Secondary | ICD-10-CM | POA: Diagnosis not present

## 2018-02-23 DIAGNOSIS — R7301 Impaired fasting glucose: Secondary | ICD-10-CM | POA: Diagnosis not present

## 2018-04-17 DIAGNOSIS — G478 Other sleep disorders: Secondary | ICD-10-CM | POA: Diagnosis not present

## 2018-04-17 DIAGNOSIS — R0683 Snoring: Secondary | ICD-10-CM | POA: Diagnosis not present

## 2018-05-04 DIAGNOSIS — G4733 Obstructive sleep apnea (adult) (pediatric): Secondary | ICD-10-CM | POA: Diagnosis not present

## 2018-05-26 NOTE — Progress Notes (Deleted)
[name] [place],  [address] Phone: [phone] Subjective:    I'm seeing this patient by the request  of:    CC:   Nicole [name]  Nicole [name] is a 52 y.o. female coming in with complaint of ***  Onset-  Location Duration-  Character- Aggravating factors- Reliving factors-  Therapies tried-  Severity-     No past medical history on file. No past surgical history on file. Social History   Socioeconomic History  . Marital status: Married    Spouse name: Not on file  . Number of children: Not on file  . Years of education: Not on file  . Highest education level: Not on file  Occupational History  . Not on file  Social Needs  . Financial resource strain: Not on file  . Food insecurity:    Worry: Not on file    Inability: Not on file  . Transportation needs:    Medical: Not on file    Non-medical: Not on file  Tobacco Use  . Smoking status: Never Smoker  . Smokeless tobacco: Never Used  Substance and Sexual Activity  . Alcohol use: Not on file  . Drug use: Not on file  . Sexual activity: Not on file  Lifestyle  . Physical activity:    Days per week: Not on file    Minutes per session: Not on file  . Stress: Not on file  Relationships  . Social connections:    Talks on phone: Not on file    Gets together: Not on file    Attends religious service: Not on file    Active member of club or organization: Not on file    Attends meetings of clubs or organizations: Not on file    Relationship status: Not on file  Other Topics Concern  . Not on file  Social History Narrative  . Not on file   Allergies  Allergen Reactions  . Bee Venom    Family History  Problem Relation Age of Onset  . Breast cancer Mother 57         Current Outpatient Medications (Other):  Marland Kitchen  Diclofenac Sodium (PENNSAID) 2 % SOLN, Place 2 g onto the skin 2 (two) times daily. Marland Kitchen  gabapentin (NEURONTIN) 100 MG capsule, Take 2  capsules (200 mg total) by mouth at bedtime. (Patient not taking: Reported on 12/06/2017) .  Vitamin D, Ergocalciferol, (DRISDOL) 50000 units CAPS capsule, Take 1 capsule (50,000 Units total) by mouth every 7 (seven) days.    Past medical history, social, surgical and family history all reviewed in electronic medical record.  No pertanent information unless stated regarding to the chief complaint.   Review of Systems:  No headache, visual changes, nausea, vomiting, diarrhea, constipation, dizziness, abdominal pain, skin rash, fevers, chills, night sweats, weight loss, swollen lymph nodes, body aches, joint swelling, muscle aches, chest pain, shortness of breath, mood changes.   Objective  There were no vitals taken for this visit. Systems examined below as of    General: No apparent distress alert and oriented x3 mood and affect normal, dressed appropriately.  HEENT: Pupils equal, extraocular movements intact  Respiratory: Patient's speak in full sentences and does not appear short of breath  Cardiovascular: No lower extremity edema, non tender, no erythema  Skin: Warm dry intact with no signs of infection or rash on extremities or on axial skeleton.  Abdomen: Soft nontender  Neuro: Cranial nerves II through  XII are intact, neurovascularly intact in all extremities with 2+ DTRs and 2+ pulses.  Lymph: No lymphadenopathy of posterior or anterior cervical chain or axillae bilaterally.  Gait normal with good balance and coordination.  MSK:  Non tender with full range of motion and good stability and symmetric strength and tone of shoulders, elbows, wrist, hip, knee and ankles bilaterally.     Impression and Recommendations:     This case required medical decision making of moderate complexity. The above documentation has been reviewed and is accurate and complete Lyndal Pulley, DO       Note: This dictation was prepared with Dragon dictation along with smaller phrase technology. Any  transcriptional errors that result from this process are unintentional.

## 2018-05-29 ENCOUNTER — Ambulatory Visit: Payer: 59 | Admitting: Family Medicine

## 2018-06-02 DIAGNOSIS — Z23 Encounter for immunization: Secondary | ICD-10-CM | POA: Diagnosis not present

## 2018-06-14 ENCOUNTER — Ambulatory Visit: Payer: 59 | Admitting: Family Medicine

## 2018-06-17 NOTE — Progress Notes (Signed)
[name] [place], [place] [address] Phone: [phone] Subjective:    I Nicole [name] am serving as a Education administrator for Dr. Hulan Saas.   CC: Bilateral hip and back pain  DUK:[id number]  Nicole [name] is a 52 y.o. female coming in with complaint of hip pain. States the hips are terrible. Had an injection last visit. About 6-7 weeks ago she believes she sprained her hip. Pain down the anterior left leg/thigh. Weight bearing is painful. Also slipped in the shower which she thinks caused the sprain.        No past medical history on file. No past surgical history on file. Social History   Socioeconomic History  . Marital status: Married    Spouse name: Not on file  . Number of children: Not on file  . Years of education: Not on file  . Highest education level: Not on file  Occupational History  . Not on file  Social Needs  . Financial resource strain: Not on file  . Food insecurity:    Worry: Not on file    Inability: Not on file  . Transportation needs:    Medical: Not on file    Non-medical: Not on file  Tobacco Use  . Smoking status: Never Smoker  . Smokeless tobacco: Never Used  Substance and Sexual Activity  . Alcohol use: Not on file  . Drug use: Not on file  . Sexual activity: Not on file  Lifestyle  . Physical activity:    Days per week: Not on file    Minutes per session: Not on file  . Stress: Not on file  Relationships  . Social connections:    Talks on phone: Not on file    Gets together: Not on file    Attends religious service: Not on file    Active member of club or organization: Not on file    Attends meetings of clubs or organizations: Not on file    Relationship status: Not on file  Other Topics Concern  . Not on file  Social History Narrative  . Not on file   Allergies  Allergen Reactions  . Bee Venom    Family History  Problem Relation Age of Onset  . Breast cancer Mother 57          Current Outpatient Medications (Other):  Marland Kitchen  Diclofenac Sodium (PENNSAID) 2 % SOLN, Place 2 g onto the skin 2 (two) times daily. Marland Kitchen  gabapentin (NEURONTIN) 100 MG capsule, Take 2 capsules (200 mg total) by mouth at bedtime. .  Vitamin D, Ergocalciferol, (DRISDOL) 50000 units CAPS capsule, Take 1 capsule (50,000 Units total) by mouth every 7 (seven) days.    Past medical history, social, surgical and family history all reviewed in electronic medical record.  No pertanent information unless stated regarding to the chief complaint.   Review of Systems:  No headache, visual changes, nausea, vomiting, diarrhea, constipation, dizziness, abdominal pain, skin rash, fevers, chills, night sweats, weight loss, swollen lymph nodes, body aches, joint swelling, muscle aches, chest pain, shortness of breath, mood changes.   Objective  Blood pressure 100/70, pulse 65, height 5\' 2"  (1.575 m), weight 161 lb (73 kg), SpO2 98 %.    General: No apparent distress alert and oriented x3 mood and affect normal, dressed appropriately.  HEENT: Pupils equal, extraocular movements intact  Respiratory: Patient's speak in full sentences and does not appear short of breath  Cardiovascular: No lower  extremity edema, non tender, no erythema  Skin: Warm dry intact with no signs of infection or rash on extremities or on axial skeleton.  Abdomen: Soft nontender  Neuro: Cranial nerves II through XII are intact, neurovascularly intact in all extremities with 2+ DTRs and 2+ pulses.  Lymph: No lymphadenopathy of posterior or anterior cervical chain or axillae bilaterally.  Gait normal with good balance and coordination.  MSK:  Non tender with full range of motion and good stability and symmetric strength and tone of shoulders, elbows, wrist, hip, knee and ankles bilaterally.  Back Exam:  Inspection: Mild loss of lordosis Motion: Flexion 45 deg, Extension 25 deg, Side Bending to 45 deg bilaterally,  Rotation to 45  deg bilaterally  SLR laying: Negative  XSLR laying: Negative  Palpable tenderness: Mild tenderness noted.  Mild tenderness over the greater trochanteric area. FABER: Positive on the left. Sensory change: Gross sensation intact to all lumbar and sacral dermatomes.  Reflexes: 2+ at both patellar tendons, 2+ at achilles tendons, Babinski's downgoing.  Strength at foot  Plantar-flexion: 5/5 Dorsi-flexion: 5/5 Eversion: 5/5 Inversion: 5/5  Leg strength  Quad: 5/5 Hamstring: 5/5 Hip flexor: 5/5 Hip abductors: 5/5  Gait unremarkable. Patient though with resisted hip flexion there is some tenderness at the origin of the quadricep  Limited musculoskeletal ultrasound was performed and interpreted by Nicole [name]  Limited ultrasound of patient's left groin area shows that patient does have a very small avulsion fracture off of the AIIS at the insertion of the quadricep tendon.  Severely tender in the area.  Mild callus formation noted.  No true muscle tear appreciated Impression avulsion fracture quadricep   Impression and Recommendations:     This case required medical decision making of moderate complexity. The above documentation has been reviewed and is accurate and complete Nicole Pulley, DO       Note: This dictation was prepared with Dragon dictation along with smaller phrase technology. Any transcriptional errors that result from this process are unintentional.

## 2018-06-19 ENCOUNTER — Ambulatory Visit: Payer: 59 | Admitting: Family Medicine

## 2018-06-19 ENCOUNTER — Encounter

## 2018-06-19 ENCOUNTER — Ambulatory Visit: Payer: Self-pay

## 2018-06-19 ENCOUNTER — Encounter: Payer: Self-pay | Admitting: Family Medicine

## 2018-06-19 VITALS — BP 100/70 | HR 65 | Ht 62.0 in | Wt 161.0 lb

## 2018-06-19 DIAGNOSIS — M25551 Pain in right hip: Secondary | ICD-10-CM | POA: Diagnosis not present

## 2018-06-19 DIAGNOSIS — M25552 Pain in left hip: Secondary | ICD-10-CM | POA: Diagnosis not present

## 2018-06-19 DIAGNOSIS — S76112A Strain of left quadriceps muscle, fascia and tendon, initial encounter: Secondary | ICD-10-CM

## 2018-06-19 DIAGNOSIS — M222X2 Patellofemoral disorders, left knee: Secondary | ICD-10-CM | POA: Diagnosis not present

## 2018-06-19 DIAGNOSIS — M2241 Chondromalacia patellae, right knee: Secondary | ICD-10-CM | POA: Insufficient documentation

## 2018-06-19 MED ORDER — VITAMIN D (ERGOCALCIFEROL) 1.25 MG (50000 UNIT) PO CAPS: 50000.0000 [IU] | ORAL_CAPSULE | ORAL | 0 refills | Status: DC

## 2018-06-19 NOTE — Patient Instructions (Addendum)
Good to see you  Ice 20 minutes 2 times daily. Usually after activity and before bed. Once weekly vitamin D for 12 weeks Compression sleeve Wear knee brace with activity  K2 over the counter daily for 1 month Exercises 3 times a week.   Ok to do light working out.  See me again 4 weeks

## 2018-06-19 NOTE — Assessment & Plan Note (Signed)
Patellofemoral Syndrome  Reviewed anatomy using anatomical model and how PFS occurs.  Given rehab exercises handout for VMO, hip abductors, core, entire kinetic chain including proprioception exercises including cone touches, step downs, hip elevations and turn outs.  Could benefit from PT, regular exercise, upright biking, and a PFS knee brace to assist with tracking abnormalities.

## 2018-06-19 NOTE — Assessment & Plan Note (Signed)
This is associated with an avulsion.  Discussed vitamin D, compression, avoiding resisted knee extension for now.  Discussed icing regimen, home exercises, worsening symptoms may need further evaluation with x-rays and potentially advanced imaging.  Differential includes hip pathology but patient actually has fairly good range of motion.  Follow-up again in 4 weeks

## 2018-06-27 DIAGNOSIS — L812 Freckles: Secondary | ICD-10-CM | POA: Diagnosis not present

## 2018-06-27 DIAGNOSIS — L920 Granuloma annulare: Secondary | ICD-10-CM | POA: Diagnosis not present

## 2018-06-27 DIAGNOSIS — L821 Other seborrheic keratosis: Secondary | ICD-10-CM | POA: Diagnosis not present

## 2018-07-18 ENCOUNTER — Ambulatory Visit: Payer: 59 | Admitting: Family Medicine

## 2018-08-01 ENCOUNTER — Ambulatory Visit: Payer: 59 | Admitting: Family Medicine

## 2018-08-29 ENCOUNTER — Ambulatory Visit: Payer: 59 | Admitting: Family Medicine

## 2018-09-12 ENCOUNTER — Other Ambulatory Visit: Payer: Self-pay | Admitting: Family Medicine

## 2018-10-12 ENCOUNTER — Ambulatory Visit: Payer: 59 | Admitting: Family Medicine

## 2018-11-03 ENCOUNTER — Other Ambulatory Visit: Payer: Self-pay | Admitting: Obstetrics & Gynecology

## 2018-11-03 DIAGNOSIS — Z1231 Encounter for screening mammogram for malignant neoplasm of breast: Secondary | ICD-10-CM

## 2018-11-08 ENCOUNTER — Ambulatory Visit
Admission: RE | Admit: 2018-11-08 | Discharge: 2018-11-08 | Disposition: A | Discharge status: Home / Self Care | Payer: 59 | Source: Ambulatory Visit

## 2018-11-08 DIAGNOSIS — Z1231 Encounter for screening mammogram for malignant neoplasm of breast: Secondary | ICD-10-CM

## 2018-11-30 DIAGNOSIS — Z01419 Encounter for gynecological examination (general) (routine) without abnormal findings: Secondary | ICD-10-CM | POA: Diagnosis not present

## 2018-11-30 DIAGNOSIS — Z6831 Body mass index (BMI) 31.0-31.9, adult: Secondary | ICD-10-CM | POA: Diagnosis not present

## 2019-01-18 ENCOUNTER — Other Ambulatory Visit: Payer: Self-pay

## 2019-01-18 ENCOUNTER — Encounter (HOSPITAL_COMMUNITY): Payer: Self-pay | Admitting: Emergency Medicine

## 2019-01-18 ENCOUNTER — Emergency Department (HOSPITAL_COMMUNITY): Payer: 59

## 2019-01-18 ENCOUNTER — Observation Stay (HOSPITAL_COMMUNITY)
Admission: EM | Admit: 2019-01-18 | Discharge: 2019-01-19 | Disposition: A | Discharge status: Home / Self Care | Payer: 59 | Attending: Surgery | Admitting: Surgery

## 2019-01-18 DIAGNOSIS — G4730 Sleep apnea, unspecified: Secondary | ICD-10-CM | POA: Insufficient documentation

## 2019-01-18 DIAGNOSIS — Z1159 Encounter for screening for other viral diseases: Secondary | ICD-10-CM | POA: Insufficient documentation

## 2019-01-18 DIAGNOSIS — R1031 Right lower quadrant pain: Secondary | ICD-10-CM | POA: Diagnosis not present

## 2019-01-18 DIAGNOSIS — Z03818 Encounter for observation for suspected exposure to other biological agents ruled out: Secondary | ICD-10-CM | POA: Diagnosis not present

## 2019-01-18 DIAGNOSIS — E278 Other specified disorders of adrenal gland: Secondary | ICD-10-CM

## 2019-01-18 DIAGNOSIS — K3580 Unspecified acute appendicitis: Secondary | ICD-10-CM | POA: Diagnosis not present

## 2019-01-18 HISTORY — DX: Sleep apnea, unspecified: G47.30

## 2019-01-18 HISTORY — DX: Gastro-esophageal reflux disease without esophagitis: K21.9

## 2019-01-18 HISTORY — DX: Headache, unspecified: R51.9

## 2019-01-18 HISTORY — DX: Headache: R51

## 2019-01-18 LAB — URINALYSIS, ROUTINE W REFLEX MICROSCOPIC
Bacteria, UA: NONE SEEN
Bilirubin Urine: NEGATIVE
Glucose, UA: NEGATIVE mg/dL
Ketones, ur: 20 mg/dL — AB
Leukocytes,Ua: NEGATIVE
Nitrite: NEGATIVE
Protein, ur: NEGATIVE mg/dL
Specific Gravity, Urine: 1.015 (ref 1.005–1.030)
pH: 5 (ref 5.0–8.0)

## 2019-01-18 LAB — CBC
HCT: 40.4 % (ref 36.0–46.0)
Hemoglobin: 13.6 g/dL (ref 12.0–15.0)
MCH: 31.3 pg (ref 26.0–34.0)
MCHC: 33.7 g/dL (ref 30.0–36.0)
MCV: 92.9 fL (ref 80.0–100.0)
Platelets: 227 10*3/uL (ref 150–400)
RBC: 4.35 MIL/uL (ref 3.87–5.11)
RDW: 12.2 % (ref 11.5–15.5)
WBC: 9.9 10*3/uL (ref 4.0–10.5)
nRBC: 0 % (ref 0.0–0.2)

## 2019-01-18 LAB — PREGNANCY, URINE: Preg Test, Ur: NEGATIVE

## 2019-01-18 LAB — COMPREHENSIVE METABOLIC PANEL
ALT: 17 U/L (ref 0–44)
AST: 19 U/L (ref 15–41)
Albumin: 4.5 g/dL (ref 3.5–5.0)
Alkaline Phosphatase: 46 U/L (ref 38–126)
Anion gap: 11 (ref 5–15)
BUN: 16 mg/dL (ref 6–20)
CO2: 27 mmol/L (ref 22–32)
Calcium: 9.7 mg/dL (ref 8.9–10.3)
Chloride: 101 mmol/L (ref 98–111)
Creatinine, Ser: 1.03 mg/dL — ABNORMAL HIGH (ref 0.44–1.00)
GFR calc Af Amer: >60 mL/min (ref >60)
GFR calc non Af Amer: >60 mL/min (ref >60)
Glucose, Bld: 105 mg/dL — ABNORMAL HIGH (ref 70–99)
Potassium: 4.4 mmol/L (ref 3.5–5.1)
Sodium: 139 mmol/L (ref 135–145)
Total Bilirubin: 0.5 mg/dL (ref 0.3–1.2)
Total Protein: 7.3 g/dL (ref 6.5–8.1)

## 2019-01-18 LAB — SARS CORONAVIRUS 2 BY RT PCR (HOSPITAL ORDER, PERFORMED IN ~~LOC~~ HOSPITAL LAB): SARS Coronavirus 2: NEGATIVE

## 2019-01-18 LAB — LIPASE, BLOOD: Lipase: 34 U/L (ref 11–51)

## 2019-01-18 MED ORDER — IOHEXOL 300 MG/ML  SOLN
100.0000 mL | Freq: Once | INTRAMUSCULAR | Status: AC | PRN
  Administered 2019-01-18: 100 mL via INTRAVENOUS

## 2019-01-18 MED ORDER — SODIUM CHLORIDE 0.9% FLUSH: 3.0000 mL | Freq: Once | INTRAVENOUS | Status: DC

## 2019-01-18 MED ORDER — SODIUM CHLORIDE 0.9 % IV SOLN
2.0000 g | INTRAVENOUS | Status: DC
  Administered 2019-01-18: 2 g via INTRAVENOUS
  Filled 2019-01-18 (×2): qty 20

## 2019-01-18 NOTE — ED Provider Notes (Signed)
[facility] EMERGENCY DEPARTMENT Provider Note   CSN: [id number] Arrival date & time: 01/18/19  1647    History   Chief Complaint Chief Complaint  Patient presents with  . Abdominal Pain    HPI Nicole [name] is a 53 y.o. female here presenting with right lower quadrant pain.  Patient states that she had some lower abdominal cramps since yesterday.  She also felt nauseated and today the pain localized the right lower quadrant.  She states that she has a low-grade temperature at home but denies any fevers.  Denies any vomiting or diarrhea or urinary symptoms. Denies any previous abdominal surgeries.      The history is provided by the patient.    History reviewed. No pertinent past medical history.  Patient Active Problem List   Diagnosis Date Noted  . Strain of left quadriceps muscle 06/19/2018  . Patellofemoral syndrome, left 06/19/2018  . Greater trochanteric bursitis of both hips 11/08/2017    History reviewed. No pertinent surgical history.   OB History   No obstetric history on file.      Home Medications    Prior to Admission medications   Medication Sig Start Date End Date Taking? Authorizing Provider  Vitamin D, Ergocalciferol, (DRISDOL) 1.25 MG (50000 UT) CAPS capsule TAKE 1 CAPSULE (50,000 UNITS TOTAL) BY MOUTH EVERY 7 (SEVEN) DAYS. 09/12/18  Yes Hulan Saas M, DO  VIVELLE-DOT 0.075 MG/24HR Place 1 patch onto the skin 2 (two) times a week. 01/01/19  Yes [provider]    Family History Family History  Problem Relation Age of Onset  . Breast cancer Mother 57    Social History Social History   Tobacco Use  . Smoking status: Never Smoker  . Smokeless tobacco: Never Used  Substance Use Topics  . Alcohol use: Yes  . Drug use: Never     Allergies   Bee venom   Review of Systems Review of Systems  Gastrointestinal: Positive for abdominal pain.  All other systems reviewed and are negative.    Physical Exam  Updated Vital Signs BP 136/77 (BP Location: Left Arm)   Pulse 74   Temp 98 F (36.7 C) (Oral)   Resp 16   SpO2 100%   Physical Exam Vitals signs and nursing note reviewed.  Constitutional:      Appearance: She is well-developed.  HENT:     Head: Normocephalic.     Mouth/Throat:     Mouth: Mucous membranes are moist.  Eyes:     Extraocular Movements: Extraocular movements intact.  Cardiovascular:     Rate and Rhythm: Normal rate and regular rhythm.  Pulmonary:     Effort: Pulmonary effort is normal.     Breath sounds: Normal breath sounds.  Abdominal:     General: Abdomen is flat. Bowel sounds are normal.     Palpations: Abdomen is soft.     Comments: + RLQ tenderness, mild guarding, no rebound   Skin:    General: Skin is warm.  Neurological:     General: No focal deficit present.     Mental Status: She is alert and oriented to person, place, and time.  Psychiatric:        Mood and Affect: Mood normal.        Behavior: Behavior normal.      ED Treatments / Results  Labs (all labs ordered are listed, but only abnormal results are displayed) Labs Reviewed  COMPREHENSIVE METABOLIC PANEL - Abnormal; Notable for the  following components:      Result Value   Glucose, Bld 105 (*)    Creatinine, Ser 1.03 (*)    All other components within normal limits  URINALYSIS, ROUTINE W REFLEX MICROSCOPIC - Abnormal; Notable for the following components:   Hgb urine dipstick SMALL (*)    Ketones, ur 20 (*)    All other components within normal limits  SARS CORONAVIRUS 2 (HOSPITAL ORDER, [facility])  LIPASE, BLOOD  CBC  PREGNANCY, URINE    EKG None  Radiology Ct Abdomen Pelvis W Contrast  Result Date: 01/18/2019 CLINICAL DATA:  53 y/o  F; right lower quadrant abdominal pain. EXAM: CT ABDOMEN AND PELVIS WITH CONTRAST TECHNIQUE: Multidetector CT imaging of the abdomen and pelvis was performed using the standard protocol following bolus administration  of intravenous contrast. CONTRAST:  159mL OMNIPAQUE IOHEXOL 300 MG/ML  SOLN COMPARISON:  None. FINDINGS: Lower chest: No acute abnormality. Hepatobiliary: There several small well-circumscribed hypodense foci within the liver, the largest in liver segment 6 measuring 8 mm and liver segment 7 measuring 14 mm (series 3, image 10 and 24) probably representing hepatic cysts. Additional foci are subcentimeter. No additional focal liver abnormality is evident. Normal gallbladder. No biliary ductal dilatation. Pancreas: Unremarkable. No pancreatic ductal dilatation or surrounding inflammatory changes. Spleen: Normal in size without focal abnormality. Adrenals/Urinary Tract: Indeterminate left adrenal mass measuring 29 x 22 mm and 76 HU. Stomach/Bowel: The appendix is dilated to 9 mm and demonstrates mucosal enhancement (series 3, image 51). No acute periappendiceal collection or appendicolith. Small large bowel is otherwise unremarkable. Stomach is unremarkable. Vascular/Lymphatic: No significant vascular findings are present. No enlarged abdominal or pelvic lymph nodes. Reproductive: Uterus and bilateral adnexa are unremarkable. Other: No abdominal wall hernia or abnormality. No abdominopelvic ascites. Musculoskeletal: No acute or significant osseous findings. IMPRESSION: 1. Dilated appendix measuring up to 9 mm with mucosal enhancement compatible with acute appendicitis. No periappendiceal collection, free air, or appendicolith. 2. Indeterminate left adrenal mass measuring up to 29 mm. Further characterization with adrenal protocol CT or MRI is recommended on a nonemergent basis. This recommendation follows ACR consensus guidelines: Management of Incidental Adrenal Masses: A White Paper of the ACR Incidental Findings Committee. J Am Coll Radiol 2017;14:1038-1044. Electronically Signed   By: Kristine Garbe M.D.   On: 01/18/2019 21:38    Procedures Procedures (including critical care time)  Medications  Ordered in ED Medications  sodium chloride flush (NS) 0.9 % injection 3 mL (3 mLs Intravenous Not Given 01/18/19 2053)  iohexol (OMNIPAQUE) 300 MG/ML solution 100 mL (100 mLs Intravenous Contrast Given 01/18/19 2115)     Initial Impression / Assessment and Plan / ED Course  I have reviewed the triage vital signs and the nursing notes.  Pertinent labs & imaging results that were available during my care of the patient were reviewed by me and considered in my medical decision making (see chart for details).       Nicole [name] is a 53 y.o. female here with RLQ pain. Concerned for possible appendicitis. Will get labs, UA, CT ab/pel.   9:57 PM CT showed appendicitis with no rupture. Called Dr. Redmond Pulling from surgery to admit for appendectomy.  Final Clinical Impressions(s) / ED Diagnoses   Final diagnoses:  None    ED Discharge Orders    None       Drenda Freeze, MD 01/18/19 2157

## 2019-01-18 NOTE — H&P (Addendum)
Nicole [name] is an 53 y.o. female.   Chief Complaint: abd pain HPI: 53 yo wf developed abd bloating late yesterday followed by abd pain today. No similar symptoms. No n/v/f/c. Pain in RLQ. Was able to eat lunch today but came to ED bc persistent pain. Had BM earlier today. Has had vag hysterectomy and lap procedures on ovaries. No prior dvt  Negative verbal covid screen.  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 No pertinent past medical history.  History reviewed. No pertinent surgical history.  Family History  Problem Relation Age of Onset  . Breast cancer Mother 59   Social History:  reports that she has never smoked. She has never used smokeless tobacco. She reports current alcohol use. She reports that she does not use drugs.  Allergies:  Allergies  Allergen Reactions  . Bee Venom     (Not in a hospital admission)   Results for orders placed or performed during the hospital encounter of 01/18/19 (from the past 48 hour(s))  Lipase, blood     Status: None   Collection Time: 01/18/19  5:37 PM  Result Value Ref Range   Lipase 34 11 - 51 U/L    Comment: Performed at [facility], [place] [address] [place], [place] [address]  Comprehensive metabolic panel     Status: Abnormal   Collection Time: 01/18/19  5:37 PM  Result Value Ref Range   Sodium 139 135 - 145 mmol/L   Potassium 4.4 3.5 - 5.1 mmol/L   Chloride 101 98 - 111 mmol/L   CO2 27 22 - 32 mmol/L   Glucose, Bld 105 (H) 70 - 99 mg/dL   BUN 16 6 - 20 mg/dL   Creatinine, Ser 1.03 (H) 0.44 - 1.00 mg/dL   Calcium 9.7 8.9 - 10.3 mg/dL   Total Protein 7.3 6.5 - 8.1 g/dL   Albumin 4.5 3.5 - 5.0 g/dL   AST 19 15 - 41 U/L   ALT 17 0 - 44 U/L   Alkaline Phosphatase 46 38 - 126 U/L   Total Bilirubin 0.5 0.3 - 1.2 mg/dL   GFR calc non Af Amer >60 >60 mL/min   GFR calc Af Amer >60 >60 mL/min   Anion gap 11 5 - 15    Comment: Performed at [facility] [address]  [place], [place] [address]  CBC     Status: None   Collection Time: 01/18/19  5:37 PM  Result Value Ref Range   WBC 9.9 4.0 - 10.5 K/uL   RBC 4.35 3.87 - 5.11 MIL/uL   Hemoglobin 13.6 12.0 - 15.0 g/dL   HCT 40.4 36.0 - 46.0 %   MCV 92.9 80.0 - 100.0 fL   MCH 31.3 26.0 - 34.0 pg   MCHC 33.7 30.0 - 36.0 g/dL   RDW 12.2 11.5 - 15.5 %   Platelets 227 150 - 400 K/uL   nRBC 0.0 0.0 - 0.2 %    Comment: Performed at [facility], [place] [address], [place], [place] [address]  Urinalysis, Routine w reflex microscopic     Status: Abnormal   Collection Time: 01/18/19  9:03 PM  Result Value Ref Range   Color, Urine YELLOW YELLOW   APPearance CLEAR CLEAR   Specific Gravity, Urine 1.015 1.005 - 1.030   pH 5.0 5.0 - 8.0   Glucose, UA NEGATIVE NEGATIVE mg/dL   Hgb urine dipstick SMALL (A) NEGATIVE  Bilirubin Urine NEGATIVE NEGATIVE   Ketones, ur 20 (A) NEGATIVE mg/dL   Protein, ur NEGATIVE NEGATIVE mg/dL   Nitrite NEGATIVE NEGATIVE   Leukocytes,Ua NEGATIVE NEGATIVE   RBC / HPF 0-5 0 - 5 RBC/hpf   WBC, UA 0-5 0 - 5 WBC/hpf   Bacteria, UA NONE SEEN NONE SEEN   Squamous Epithelial / LPF 0-5 0 - 5    Comment: Performed at [facility], [place] [address], [place], [place] [address]  Pregnancy, urine     Status: None   Collection Time: 01/18/19  9:03 PM  Result Value Ref Range   Preg Test, Ur NEGATIVE NEGATIVE    Comment: Performed at [facility] [address], [place], [place] [address]   Ct Abdomen Pelvis W Contrast  Result Date: 01/18/2019 CLINICAL DATA:  54 y/o  F; right lower quadrant abdominal pain. EXAM: CT ABDOMEN AND PELVIS WITH CONTRAST TECHNIQUE: Multidetector CT imaging of the abdomen and pelvis was performed using the standard protocol following bolus administration of intravenous contrast. CONTRAST:  147mL OMNIPAQUE IOHEXOL 300 MG/ML  SOLN COMPARISON:  None. FINDINGS: Lower chest: No acute abnormality. Hepatobiliary: There several small well-circumscribed hypodense foci  within the liver, the largest in liver segment 6 measuring 8 mm and liver segment 7 measuring 14 mm (series 3, image 10 and 24) probably representing hepatic cysts. Additional foci are subcentimeter. No additional focal liver abnormality is evident. Normal gallbladder. No biliary ductal dilatation. Pancreas: Unremarkable. No pancreatic ductal dilatation or surrounding inflammatory changes. Spleen: Normal in size without focal abnormality. Adrenals/Urinary Tract: Indeterminate left adrenal mass measuring 29 x 22 mm and 76 HU. Stomach/Bowel: The appendix is dilated to 9 mm and demonstrates mucosal enhancement (series 3, image 51). No acute periappendiceal collection or appendicolith. Small large bowel is otherwise unremarkable. Stomach is unremarkable. Vascular/Lymphatic: No significant vascular findings are present. No enlarged abdominal or pelvic lymph nodes. Reproductive: Uterus and bilateral adnexa are unremarkable. Other: No abdominal wall hernia or abnormality. No abdominopelvic ascites. Musculoskeletal: No acute or significant osseous findings. IMPRESSION: 1. Dilated appendix measuring up to 9 mm with mucosal enhancement compatible with acute appendicitis. No periappendiceal collection, free air, or appendicolith. 2. Indeterminate left adrenal mass measuring up to 29 mm. Further characterization with adrenal protocol CT or MRI is recommended on a nonemergent basis. This recommendation follows ACR consensus guidelines: Management of Incidental Adrenal Masses: A White Paper of the ACR Incidental Findings Committee. J Am Coll Radiol 2017;14:1038-1044. Electronically Signed   By: Kristine Garbe M.D.   On: 01/18/2019 21:38    Review of Systems  Constitutional: Negative for chills and fever.  Respiratory: Negative for cough, sputum production and shortness of breath.   Cardiovascular: Negative for chest pain, palpitations, orthopnea and PND.  Gastrointestinal: Positive for abdominal pain. Negative  for blood in stool, constipation, diarrhea and vomiting.  All other systems reviewed and are negative.   Blood pressure 129/70, pulse 74, temperature 98 F (36.7 C), temperature source Oral, resp. rate 16, SpO2 100 %. Physical Exam  Vitals reviewed. Constitutional: She is oriented to person, place, and time. She appears well-developed and well-nourished. No distress.  wf resting comfortably; nad, nontoxic  HENT:  Head: Normocephalic and atraumatic.  Right Ear: External ear normal.  Left Ear: External ear normal.  Eyes: Conjunctivae are normal. No scleral icterus.  Neck: Normal range of motion. Neck supple. No tracheal deviation present. No thyromegaly present.  Cardiovascular: Normal rate and normal heart sounds.  Respiratory: Effort normal and  breath sounds normal. No stridor. No respiratory distress. She has no wheezes.  GI: Soft. She exhibits no distension. There is abdominal tenderness in the right lower quadrant. There is no rigidity, no rebound and no guarding.    Soft, +RLQ TTP but def no peritonitis, guarding. Old trocar scars  Musculoskeletal:        General: No tenderness or edema.  Lymphadenopathy:    She has no cervical adenopathy.  Neurological: She is alert and oriented to person, place, and time. She exhibits normal muscle tone.  Skin: Skin is warm and dry. No rash noted. She is not diaphoretic. No erythema. No pallor.  Psychiatric: She has a normal mood and affect. Her behavior is normal. Judgment and thought content normal.     Assessment/Plan Acute appendicitis Left adrenal mass  We discussed the etiology and management of acute appendicitis. We discussed operative and nonoperative management.  I recommended operative management along with IV antibiotics.  We discussed laparoscopic appendectomy (boyfriend on speakerphone). We discussed the risk and benefits of surgery including but not limited to bleeding, infection, injury to surrounding structures, need to  convert to an open procedure, blood clot formation, post operative abscess or wound infection, staple line complications such as leak or bleeding, hernia formation, post operative ileus, need for additional procedures, anesthesia complications, and the typical postoperative course. I explained that the patient should expect a good improvement in their symptoms.  We also discussed potential exposure to covid during hospitalization; however I believe surgical mgmt of her appendicitis will lead to a quicker discharge than nonsurgical mgmt  Admit  Iv abx Lap appy tomorrow am with dr Brantley Stage - covid test is still pending at this time, afebrile, nml wbc, no peritonitis/guarding on exam Eras meds on call outpt followup for L adrenal mass   Leighton Ruff. Redmond Pulling, MD, FACS General, Bariatric, & Minimally Invasive Surgery [facility], [place]  Greer Pickerel, MD 01/18/2019, 10:52 PM

## 2019-01-18 NOTE — ED Notes (Signed)
ED TO INPATIENT HANDOFF REPORT  ED Nurse Name and Phone #: Dalphine Handing [phone]  S Name/Age/Gender Aris Georgia 53 y.o. female Room/Bed: 029C/029C  Code Status   Code Status: Not on file  Home/SNF/Other Home Patient oriented to: self, place, time and situation Is this baseline? Yes   Triage Complete: Triage complete  Chief Complaint R sided flank pain, Stomach pain  Triage Note Pt c/o RLQ pain that started yesterday. Denies nausea/vomiting/diarrhea, no urinary symptoms.    Allergies Allergies  Allergen Reactions  . Bee Venom     Level of Care/Admitting Diagnosis ED Disposition    ED Disposition Condition [facility]: [place] [[id number]]  Level of Care: Med-Surg [16]  Covid Evaluation: Screening Protocol (No Symptoms)  Diagnosis: Acute appendicitis [570177]  Admitting Physician: CCS, [place]  Attending Physician: CCS, MD [[id number]  PT Class (Do Not Modify): Observation [104]  PT Acc Code (Do Not Modify): Observation [10022]       B Medical/Surgery History History reviewed. No pertinent past medical history. History reviewed. No pertinent surgical history.   A IV Location/Drains/Wounds Patient Lines/Drains/Airways Status   Active Line/Drains/Airways    Name:   Placement date:   Placement time:   Site:   Days:   Peripheral IV 01/18/19 Right Antecubital   01/18/19    2049    Antecubital   less than 1          Intake/Output Last 24 hours No intake or output data in the 24 hours ending 01/18/19 2336  Labs/Imaging Results for orders placed or performed during the hospital encounter of 01/18/19 (from the past 48 hour(s))  Lipase, blood     Status: None   Collection Time: 01/18/19  5:37 PM  Result Value Ref Range   Lipase 34 11 - 51 U/L    Comment: Performed at [facility] [address] [place], [place] [address]  Comprehensive metabolic panel     Status: Abnormal   Collection Time: 01/18/19  5:37 PM  Result  Value Ref Range   Sodium 139 135 - 145 mmol/L   Potassium 4.4 3.5 - 5.1 mmol/L   Chloride 101 98 - 111 mmol/L   CO2 27 22 - 32 mmol/L   Glucose, Bld 105 (H) 70 - 99 mg/dL   BUN 16 6 - 20 mg/dL   Creatinine, Ser 1.03 (H) 0.44 - 1.00 mg/dL   Calcium 9.7 8.9 - 10.3 mg/dL   Total Protein 7.3 6.5 - 8.1 g/dL   Albumin 4.5 3.5 - 5.0 g/dL   AST 19 15 - 41 U/L   ALT 17 0 - 44 U/L   Alkaline Phosphatase 46 38 - 126 U/L   Total Bilirubin 0.5 0.3 - 1.2 mg/dL   GFR calc non Af Amer >60 >60 mL/min   GFR calc Af Amer >60 >60 mL/min   Anion gap 11 5 - 15    Comment: Performed at [facility] [address], [place] [address]  CBC     Status: None   Collection Time: 01/18/19  5:37 PM  Result Value Ref Range   WBC 9.9 4.0 - 10.5 K/uL   RBC 4.35 3.87 - 5.11 MIL/uL   Hemoglobin 13.6 12.0 - 15.0 g/dL   HCT 40.4 36.0 - 46.0 %   MCV 92.9 80.0 - 100.0 fL   MCH 31.3 26.0 - 34.0 pg   MCHC 33.7 30.0 - 36.0 g/dL   RDW  12.2 11.5 - 15.5 %   Platelets 227 150 - 400 K/uL   nRBC 0.0 0.0 - 0.2 %    Comment: Performed at [facility], [place] [address] [place], [place] [address]  Urinalysis, Routine w reflex microscopic     Status: Abnormal   Collection Time: 01/18/19  9:03 PM  Result Value Ref Range   Color, Urine YELLOW YELLOW   APPearance CLEAR CLEAR   Specific Gravity, Urine 1.015 1.005 - 1.030   pH 5.0 5.0 - 8.0   Glucose, UA NEGATIVE NEGATIVE mg/dL   Hgb urine dipstick SMALL (A) NEGATIVE   Bilirubin Urine NEGATIVE NEGATIVE   Ketones, ur 20 (A) NEGATIVE mg/dL   Protein, ur NEGATIVE NEGATIVE mg/dL   Nitrite NEGATIVE NEGATIVE   Leukocytes,Ua NEGATIVE NEGATIVE   RBC / HPF 0-5 0 - 5 RBC/hpf   WBC, UA 0-5 0 - 5 WBC/hpf   Bacteria, UA NONE SEEN NONE SEEN   Squamous Epithelial / LPF 0-5 0 - 5    Comment: Performed at [facility], [place] [address] [place], [place] [address]  Pregnancy, urine     Status: None   Collection Time: 01/18/19  9:03 PM  Result Value Ref Range    Preg Test, Ur NEGATIVE NEGATIVE    Comment: Performed at [facility] [address], [place],  [address]  SARS Coronavirus 2 (CEPHEID - Performed in [facility] hospital lab), Hosp Order     Status: None   Collection Time: 01/18/19  9:59 PM  Result Value Ref Range   SARS Coronavirus 2 NEGATIVE NEGATIVE    Comment: (NOTE) If result is NEGATIVE SARS-CoV-2 target nucleic acids are NOT DETECTED. The SARS-CoV-2 RNA is generally detectable in upper and lower  respiratory specimens during the acute phase of infection. The lowest  concentration of SARS-CoV-2 viral copies this assay can detect is 250  copies / mL. A negative result does not preclude SARS-CoV-2 infection  and should not be used as the sole basis for treatment or other  patient management decisions.  A negative result may occur with  improper specimen collection / handling, submission of specimen other  than nasopharyngeal swab, presence of viral mutation(s) within the  areas targeted by this assay, and inadequate number of viral copies  (<250 copies / mL). A negative result must be combined with clinical  observations, patient history, and epidemiological information. If result is POSITIVE SARS-CoV-2 target nucleic acids are DETECTED. The SARS-CoV-2 RNA is generally detectable in upper and lower  respiratory specimens dur ing the acute phase of infection.  Positive  results are indicative of active infection with SARS-CoV-2.  Clinical  correlation with patient history and other diagnostic information is  necessary to determine patient infection status.  Positive results do  not rule out bacterial infection or co-infection with other viruses. If result is PRESUMPTIVE POSTIVE SARS-CoV-2 nucleic acids MAY BE PRESENT.   A presumptive positive result was obtained on the submitted specimen  and confirmed on repeat testing.  While 2019 novel coronavirus  (SARS-CoV-2) nucleic acids may be present in the submitted sample   additional confirmatory testing may be necessary for epidemiological  and / or clinical management purposes  to differentiate between  SARS-CoV-2 and other Sarbecovirus currently known to infect humans.  If clinically indicated additional testing with an alternate test  methodology [phone]) is advised. The SARS-CoV-2 RNA is generally  detectable in upper and lower respiratory sp ecimens during the acute  phase  of infection. The expected result is Negative. Fact Sheet for Patients:  StrictlyIdeas.no Fact Sheet for Healthcare Providers: BankingDealers.co.za This test is not yet approved or cleared by the [place] FDA and has been authorized for detection and/or diagnosis of SARS-CoV-2 by FDA under an Emergency Use Authorization (EUA).  This EUA will remain in effect (meaning this test can be used) for the duration of the COVID-19 declaration under Section 564(b)(1) of the Act, 21 U.S.C. section 360bbb-3(b)(1), unless the authorization is terminated or revoked sooner. Performed at [facility], [place] [address], [place], [place] [address]    Ct Abdomen Pelvis W Contrast  Result Date: 01/18/2019 CLINICAL DATA:  53 y/o  F; right lower quadrant abdominal pain. EXAM: CT ABDOMEN AND PELVIS WITH CONTRAST TECHNIQUE: Multidetector CT imaging of the abdomen and pelvis was performed using the standard protocol following bolus administration of intravenous contrast. CONTRAST:  179mL OMNIPAQUE IOHEXOL 300 MG/ML  SOLN COMPARISON:  None. FINDINGS: Lower chest: No acute abnormality. Hepatobiliary: There several small well-circumscribed hypodense foci within the liver, the largest in liver segment 6 measuring 8 mm and liver segment 7 measuring 14 mm (series 3, image 10 and 24) probably representing hepatic cysts. Additional foci are subcentimeter. No additional focal liver abnormality is evident. Normal gallbladder. No biliary ductal dilatation.  Pancreas: Unremarkable. No pancreatic ductal dilatation or surrounding inflammatory changes. Spleen: Normal in size without focal abnormality. Adrenals/Urinary Tract: Indeterminate left adrenal mass measuring 29 x 22 mm and 76 HU. Stomach/Bowel: The appendix is dilated to 9 mm and demonstrates mucosal enhancement (series 3, image 51). No acute periappendiceal collection or appendicolith. Small large bowel is otherwise unremarkable. Stomach is unremarkable. Vascular/Lymphatic: No significant vascular findings are present. No enlarged abdominal or pelvic lymph nodes. Reproductive: Uterus and bilateral adnexa are unremarkable. Other: No abdominal wall hernia or abnormality. No abdominopelvic ascites. Musculoskeletal: No acute or significant osseous findings. IMPRESSION: 1. Dilated appendix measuring up to 9 mm with mucosal enhancement compatible with acute appendicitis. No periappendiceal collection, free air, or appendicolith. 2. Indeterminate left adrenal mass measuring up to 29 mm. Further characterization with adrenal protocol CT or MRI is recommended on a nonemergent basis. This recommendation follows ACR consensus guidelines: Management of Incidental Adrenal Masses: A White Paper of the ACR Incidental Findings Committee. J Am Coll Radiol 2017;14:1038-1044. Electronically Signed   By: Kristine Garbe M.D.   On: 01/18/2019 21:38    Pending Labs Unresulted Labs (From admission, onward)    Start     Ordered   Signed and Held  HIV antibody (Routine Testing)  Tomorrow morning,   R     Signed and Held          Vitals/Pain Today's Vitals   01/18/19 2200 01/18/19 2332 01/18/19 2332 01/18/19 2333  BP: 129/70   134/64  Pulse:    63  Resp:    17  Temp:      TempSrc:      SpO2:  100%  100%  PainSc:   1      Isolation Precautions No active isolations  Medications Medications  sodium chloride flush (NS) 0.9 % injection 3 mL (3 mLs Intravenous Not Given 01/18/19 2053)  cefTRIAXone (ROCEPHIN)  2 g in sodium chloride 0.9 % 100 mL IVPB (2 g Intravenous New Bag/Given 01/18/19 2323)  iohexol (OMNIPAQUE) 300 MG/ML solution 100 mL (100 mLs Intravenous Contrast Given 01/18/19 2115)    Mobility walks Low fall risk   Focused Assessments Appendix    R Recommendations:  See Admitting Provider Note  Report given to:   Additional Notes:  IV rocephin started.

## 2019-01-18 NOTE — ED Triage Notes (Signed)
Pt c/o RLQ pain that started yesterday. Denies nausea/vomiting/diarrhea, no urinary symptoms.

## 2019-01-19 ENCOUNTER — Observation Stay (HOSPITAL_COMMUNITY): Payer: 59 | Admitting: Certified Registered Nurse Anesthetist

## 2019-01-19 ENCOUNTER — Encounter (HOSPITAL_COMMUNITY)
Admission: EM | Disposition: A | Discharge status: Home / Self Care | Payer: Self-pay | Source: Home / Self Care | Attending: Emergency Medicine

## 2019-01-19 ENCOUNTER — Encounter (HOSPITAL_COMMUNITY): Payer: Self-pay | Admitting: *Deleted

## 2019-01-19 DIAGNOSIS — E278 Other specified disorders of adrenal gland: Secondary | ICD-10-CM

## 2019-01-19 DIAGNOSIS — G4730 Sleep apnea, unspecified: Secondary | ICD-10-CM | POA: Diagnosis not present

## 2019-01-19 DIAGNOSIS — Z1159 Encounter for screening for other viral diseases: Secondary | ICD-10-CM | POA: Diagnosis not present

## 2019-01-19 DIAGNOSIS — K3580 Unspecified acute appendicitis: Secondary | ICD-10-CM | POA: Diagnosis not present

## 2019-01-19 HISTORY — PX: LAPAROSCOPIC APPENDECTOMY: SHX408

## 2019-01-19 LAB — SURGICAL PCR SCREEN
MRSA, PCR: NEGATIVE
Staphylococcus aureus: POSITIVE — AB

## 2019-01-19 LAB — HIV ANTIBODY (ROUTINE TESTING W REFLEX): HIV Screen 4th Generation wRfx: NONREACTIVE

## 2019-01-19 SURGERY — APPENDECTOMY, LAPAROSCOPIC
Anesthesia: General | Site: Abdomen

## 2019-01-19 MED ORDER — LACTATED RINGERS IV SOLN
INTRAVENOUS | Status: DC
  Administered 2019-01-19 (×2): via INTRAVENOUS

## 2019-01-19 MED ORDER — BUPIVACAINE-EPINEPHRINE 0.5% -1:200000 IJ SOLN
INTRAMUSCULAR | Status: DC | PRN
  Administered 2019-01-19: 5.5 mL

## 2019-01-19 MED ORDER — OXYCODONE-ACETAMINOPHEN 5-325 MG PO TABS: 1.0000 | ORAL_TABLET | ORAL | Status: DC | PRN

## 2019-01-19 MED ORDER — ACETAMINOPHEN 10 MG/ML IV SOLN: 1000.0000 mg | Freq: Once | INTRAVENOUS | Status: DC | PRN

## 2019-01-19 MED ORDER — MORPHINE SULFATE (PF) 2 MG/ML IV SOLN: 1.0000 mg | INTRAVENOUS | Status: DC | PRN

## 2019-01-19 MED ORDER — DEXAMETHASONE SODIUM PHOSPHATE 10 MG/ML IJ SOLN
INTRAMUSCULAR | Status: DC | PRN
  Administered 2019-01-19: 5 mg via INTRAVENOUS

## 2019-01-19 MED ORDER — DIPHENHYDRAMINE HCL 12.5 MG/5ML PO ELIX: 12.5000 mg | ORAL_SOLUTION | Freq: Four times a day (QID) | ORAL | Status: DC | PRN

## 2019-01-19 MED ORDER — ACETAMINOPHEN 160 MG/5ML PO SOLN: 1000.0000 mg | Freq: Once | ORAL | Status: DC | PRN

## 2019-01-19 MED ORDER — GABAPENTIN 300 MG PO CAPS
300.0000 mg | ORAL_CAPSULE | Freq: Once | ORAL | Status: AC
  Administered 2019-01-19: 06:00:00 300 mg via ORAL
  Filled 2019-01-19: qty 1

## 2019-01-19 MED ORDER — ROCURONIUM BROMIDE 50 MG/5ML IV SOSY
PREFILLED_SYRINGE | INTRAVENOUS | Status: DC | PRN
  Administered 2019-01-19: 40 mg via INTRAVENOUS

## 2019-01-19 MED ORDER — ONDANSETRON HCL 4 MG/2ML IJ SOLN
INTRAMUSCULAR | Status: AC
  Filled 2019-01-19: qty 2

## 2019-01-19 MED ORDER — MIDAZOLAM HCL 2 MG/2ML IJ SOLN
INTRAMUSCULAR | Status: DC | PRN
  Administered 2019-01-19: 2 mg via INTRAVENOUS

## 2019-01-19 MED ORDER — SODIUM CHLORIDE 0.9 % IR SOLN
Status: DC | PRN
  Administered 2019-01-19: 1

## 2019-01-19 MED ORDER — FENTANYL CITRATE (PF) 250 MCG/5ML IJ SOLN
INTRAMUSCULAR | Status: DC | PRN
  Administered 2019-01-19: 150 ug via INTRAVENOUS

## 2019-01-19 MED ORDER — ACETAMINOPHEN 500 MG PO TABS: 1000.0000 mg | ORAL_TABLET | Freq: Four times a day (QID) | ORAL | 0 refills | Status: DC | PRN

## 2019-01-19 MED ORDER — BUPIVACAINE-EPINEPHRINE (PF) 0.25% -1:200000 IJ SOLN
INTRAMUSCULAR | Status: AC
  Filled 2019-01-19: qty 30

## 2019-01-19 MED ORDER — IBUPROFEN 600 MG PO TABS: 600.0000 mg | ORAL_TABLET | Freq: Four times a day (QID) | ORAL | 0 refills | Status: DC | PRN

## 2019-01-19 MED ORDER — PANTOPRAZOLE SODIUM 40 MG IV SOLR
40.0000 mg | Freq: Every day | INTRAVENOUS | Status: DC
  Administered 2019-01-19: 40 mg via INTRAVENOUS
  Filled 2019-01-19: qty 40

## 2019-01-19 MED ORDER — ROCURONIUM BROMIDE 10 MG/ML (PF) SYRINGE
PREFILLED_SYRINGE | INTRAVENOUS | Status: AC
  Filled 2019-01-19: qty 10

## 2019-01-19 MED ORDER — LIDOCAINE 2% (20 MG/ML) 5 ML SYRINGE
INTRAMUSCULAR | Status: AC
  Filled 2019-01-19: qty 5

## 2019-01-19 MED ORDER — FENTANYL CITRATE (PF) 250 MCG/5ML IJ SOLN
INTRAMUSCULAR | Status: AC
  Filled 2019-01-19: qty 5

## 2019-01-19 MED ORDER — ACETAMINOPHEN 500 MG PO TABS
1000.0000 mg | ORAL_TABLET | Freq: Four times a day (QID) | ORAL | Status: DC
  Administered 2019-01-19 (×3): 1000 mg via ORAL
  Filled 2019-01-19 (×3): qty 2

## 2019-01-19 MED ORDER — LIDOCAINE 2% (20 MG/ML) 5 ML SYRINGE
INTRAMUSCULAR | Status: DC | PRN
  Administered 2019-01-19: 60 mg via INTRAVENOUS

## 2019-01-19 MED ORDER — OXYCODONE HCL 5 MG PO TABS
5.0000 mg | ORAL_TABLET | ORAL | Status: DC | PRN
  Administered 2019-01-19: 15:00:00 5 mg via ORAL
  Filled 2019-01-19: qty 1

## 2019-01-19 MED ORDER — ONDANSETRON HCL 4 MG/2ML IJ SOLN: 4.0000 mg | Freq: Four times a day (QID) | INTRAMUSCULAR | Status: DC | PRN

## 2019-01-19 MED ORDER — SCOPOLAMINE 1 MG/3DAYS TD PT72
MEDICATED_PATCH | TRANSDERMAL | Status: DC | PRN
  Administered 2019-01-19: 1 via TRANSDERMAL

## 2019-01-19 MED ORDER — SUCCINYLCHOLINE CHLORIDE 200 MG/10ML IV SOSY
PREFILLED_SYRINGE | INTRAVENOUS | Status: DC | PRN
  Administered 2019-01-19: 70 mg via INTRAVENOUS

## 2019-01-19 MED ORDER — SUCCINYLCHOLINE CHLORIDE 200 MG/10ML IV SOSY
PREFILLED_SYRINGE | INTRAVENOUS | Status: AC
  Filled 2019-01-19: qty 10

## 2019-01-19 MED ORDER — SUGAMMADEX SODIUM 200 MG/2ML IV SOLN
INTRAVENOUS | Status: DC | PRN
  Administered 2019-01-19: 146 mg via INTRAVENOUS

## 2019-01-19 MED ORDER — OXYCODONE HCL 5 MG/5ML PO SOLN: 5.0000 mg | Freq: Once | ORAL | Status: DC | PRN

## 2019-01-19 MED ORDER — ONDANSETRON 4 MG PO TBDP: 4.0000 mg | ORAL_TABLET | Freq: Four times a day (QID) | ORAL | 0 refills | Status: DC | PRN

## 2019-01-19 MED ORDER — OXYCODONE HCL 5 MG PO TABS: 5.0000 mg | ORAL_TABLET | Freq: Four times a day (QID) | ORAL | 0 refills | Status: DC | PRN

## 2019-01-19 MED ORDER — PROPOFOL 10 MG/ML IV BOLUS
INTRAVENOUS | Status: DC | PRN
  Administered 2019-01-19: 140 mg via INTRAVENOUS

## 2019-01-19 MED ORDER — PHENYLEPHRINE HCL (PRESSORS) 10 MG/ML IV SOLN
INTRAVENOUS | Status: DC | PRN
  Administered 2019-01-19 (×2): 80 ug via INTRAVENOUS
  Administered 2019-01-19 (×2): 120 ug via INTRAVENOUS

## 2019-01-19 MED ORDER — EPHEDRINE SULFATE 50 MG/ML IJ SOLN
INTRAMUSCULAR | Status: DC | PRN
  Administered 2019-01-19: 15 mg via INTRAVENOUS
  Administered 2019-01-19: 10 mg via INTRAVENOUS

## 2019-01-19 MED ORDER — OXYCODONE HCL 5 MG PO TABS: 5.0000 mg | ORAL_TABLET | Freq: Once | ORAL | Status: DC | PRN

## 2019-01-19 MED ORDER — 0.9 % SODIUM CHLORIDE (POUR BTL) OPTIME
TOPICAL | Status: DC | PRN
  Administered 2019-01-19: 1000 mL

## 2019-01-19 MED ORDER — KCL IN DEXTROSE-NACL 20-5-0.45 MEQ/L-%-% IV SOLN
INTRAVENOUS | Status: DC
  Administered 2019-01-19: 02:00:00 via INTRAVENOUS
  Filled 2019-01-19: qty 1000

## 2019-01-19 MED ORDER — DIPHENHYDRAMINE HCL 50 MG/ML IJ SOLN: 12.5000 mg | Freq: Four times a day (QID) | INTRAMUSCULAR | Status: DC | PRN

## 2019-01-19 MED ORDER — FENTANYL CITRATE (PF) 100 MCG/2ML IJ SOLN: 25.0000 ug | INTRAMUSCULAR | Status: DC | PRN

## 2019-01-19 MED ORDER — ACETAMINOPHEN 500 MG PO TABS: 1000.0000 mg | ORAL_TABLET | Freq: Once | ORAL | Status: DC | PRN

## 2019-01-19 MED ORDER — SIMETHICONE 80 MG PO CHEW: 40.0000 mg | CHEWABLE_TABLET | Freq: Four times a day (QID) | ORAL | Status: DC | PRN

## 2019-01-19 MED ORDER — DEXAMETHASONE SODIUM PHOSPHATE 10 MG/ML IJ SOLN
INTRAMUSCULAR | Status: AC
  Filled 2019-01-19: qty 1

## 2019-01-19 MED ORDER — IBUPROFEN 600 MG PO TABS: 600.0000 mg | ORAL_TABLET | Freq: Four times a day (QID) | ORAL | Status: DC | PRN

## 2019-01-19 MED ORDER — MIDAZOLAM HCL 2 MG/2ML IJ SOLN
INTRAMUSCULAR | Status: AC
  Filled 2019-01-19: qty 2

## 2019-01-19 MED ORDER — HEPARIN SODIUM (PORCINE) 5000 UNIT/ML IJ SOLN: 5000.0000 [IU] | Freq: Once | INTRAMUSCULAR | Status: DC

## 2019-01-19 MED ORDER — ONDANSETRON HCL 4 MG/2ML IJ SOLN
INTRAMUSCULAR | Status: DC | PRN
  Administered 2019-01-19: 4 mg via INTRAVENOUS

## 2019-01-19 MED ORDER — ONDANSETRON 4 MG PO TBDP: 4.0000 mg | ORAL_TABLET | Freq: Four times a day (QID) | ORAL | Status: DC | PRN

## 2019-01-19 SURGICAL SUPPLY — 46 items
ADH SKN CLS APL DERMABOND .7 (GAUZE/BANDAGES/DRESSINGS) ×1
APPLIER CLIP ROT 10 11.4 M/L (STAPLE) ×2
APR CLP MED LRG 11.4X10 (STAPLE) ×1
BAG SPEC RTRVL 10 TROC 200 (ENDOMECHANICALS) ×1
BLADE CLIPPER SURG (BLADE) IMPLANT
CANISTER SUCT 3000ML PPV (MISCELLANEOUS) ×2 IMPLANT
CHLORAPREP W/TINT 26ML (MISCELLANEOUS) ×2 IMPLANT
CLIP APPLIE ROT 10 11.4 M/L (STAPLE) IMPLANT
COVER SURGICAL LIGHT HANDLE (MISCELLANEOUS) ×2 IMPLANT
COVER WAND RF STERILE (DRAPES) ×2 IMPLANT
CUTTER FLEX LINEAR 45M (STAPLE) ×2 IMPLANT
DERMABOND ADVANCED (GAUZE/BANDAGES/DRESSINGS) ×1
DERMABOND ADVANCED .7 DNX12 (GAUZE/BANDAGES/DRESSINGS) ×1 IMPLANT
DRAPE WARM FLUID 44X44 (DRAPE) ×2 IMPLANT
ELECT REM PT RETURN 9FT ADLT (ELECTROSURGICAL) ×2
ELECTRODE REM PT RTRN 9FT ADLT (ELECTROSURGICAL) ×1 IMPLANT
ENDOLOOP SUT PDS II  0 18 (SUTURE)
ENDOLOOP SUT PDS II 0 18 (SUTURE) IMPLANT
GLOVE BIO SURGEON STRL SZ8 (GLOVE) ×2 IMPLANT
GLOVE BIOGEL PI IND STRL 8 (GLOVE) ×1 IMPLANT
GLOVE BIOGEL PI INDICATOR 8 (GLOVE) ×1
GOWN STRL REUS W/ TWL LRG LVL3 (GOWN DISPOSABLE) ×2 IMPLANT
GOWN STRL REUS W/ TWL XL LVL3 (GOWN DISPOSABLE) ×1 IMPLANT
GOWN STRL REUS W/TWL LRG LVL3 (GOWN DISPOSABLE) ×4
GOWN STRL REUS W/TWL XL LVL3 (GOWN DISPOSABLE) ×2
KIT BASIN OR (CUSTOM PROCEDURE TRAY) ×2 IMPLANT
KIT TURNOVER KIT B (KITS) ×2 IMPLANT
NS IRRIG 1000ML POUR BTL (IV SOLUTION) ×2 IMPLANT
PAD ARMBOARD 7.5X6 YLW CONV (MISCELLANEOUS) ×4 IMPLANT
POUCH RETRIEVAL ECOSAC 10 (ENDOMECHANICALS) ×1 IMPLANT
POUCH RETRIEVAL ECOSAC 10MM (ENDOMECHANICALS) ×1
RELOAD STAPLE 45 3.5 BLU ETS (ENDOMECHANICALS) ×1 IMPLANT
RELOAD STAPLE TA45 3.5 REG BLU (ENDOMECHANICALS) ×2 IMPLANT
SCISSORS LAP 5X35 DISP (ENDOMECHANICALS) ×2 IMPLANT
SET IRRIG TUBING LAPAROSCOPIC (IRRIGATION / IRRIGATOR) ×2 IMPLANT
SET TUBE SMOKE EVAC HIGH FLOW (TUBING) ×2 IMPLANT
SHEARS HARMONIC ACE PLUS 36CM (ENDOMECHANICALS) ×2 IMPLANT
SPECIMEN JAR SMALL (MISCELLANEOUS) ×2 IMPLANT
SUT MON AB 4-0 PC3 18 (SUTURE) ×2 IMPLANT
TOWEL OR 17X24 6PK STRL BLUE (TOWEL DISPOSABLE) ×2 IMPLANT
TOWEL OR 17X26 10 PK STRL BLUE (TOWEL DISPOSABLE) ×2 IMPLANT
TRAY FOLEY CATH SILVER 16FR (SET/KITS/TRAYS/PACK) ×2 IMPLANT
TRAY LAPAROSCOPIC MC (CUSTOM PROCEDURE TRAY) ×2 IMPLANT
TROCAR XCEL BLADELESS 5X75MML (TROCAR) ×4 IMPLANT
TROCAR XCEL BLUNT TIP 100MML (ENDOMECHANICALS) ×2 IMPLANT
WATER STERILE IRR 1000ML POUR (IV SOLUTION) ×2 IMPLANT

## 2019-01-19 NOTE — Anesthesia Procedure Notes (Signed)
Procedure Name: Intubation Date/Time: 01/19/2019 9:54 AM Performed by: Kathryne Hitch, CRNA Pre-anesthesia Checklist: Patient identified, Emergency Drugs available, Suction available, Patient being monitored and Timeout performed Patient Re-evaluated:Patient Re-evaluated prior to induction Oxygen Delivery Method: Circle system utilized Preoxygenation: Pre-oxygenation with 100% oxygen Induction Type: IV induction, Rapid sequence and Cricoid Pressure applied Laryngoscope Size: Miller and 2 Grade View: Grade I Tube type: Oral Tube size: 7.0 mm Number of attempts: 1 Airway Equipment and Method: Patient positioned with wedge pillow and Stylet Secured at: 22 cm Tube secured with: Tape Dental Injury: Teeth and Oropharynx as per pre-operative assessment

## 2019-01-19 NOTE — Progress Notes (Signed)
Discharge instruction packet provided to the patient.  The patient verbalizes understanding those instructions.  The patient will be discharged to her home via wheel chair.

## 2019-01-19 NOTE — Transfer of Care (Signed)
Immediate Anesthesia Transfer of Care Note  Patient: Nicole [name]  Procedure(s) Performed: APPENDECTOMY LAPAROSCOPIC (N/A Abdomen)  Patient Location: PACU  Anesthesia Type:General  Level of Consciousness: awake, alert , oriented and patient cooperative  Airway & Oxygen Therapy: Patient Spontanous Breathing and Patient connected to face mask oxygen  Post-op Assessment: Report given to RN and Post -op Vital signs reviewed and stable  Post vital signs: Reviewed and stable  Last Vitals:  Vitals Value Taken Time  BP    Temp    Pulse    Resp    SpO2      Last Pain:  Vitals:   01/19/19 0806  TempSrc:   PainSc: 0-No pain         Complications: No apparent anesthesia complications and Patient re-intubated

## 2019-01-19 NOTE — Progress Notes (Signed)
Report given to Raquel Sarna, RN.  The patient is ready for surgery

## 2019-01-19 NOTE — Discharge Instructions (Signed)
[facility]  LAPAROSCOPIC SURGERY: POST OP INSTRUCTIONS Always review your discharge instruction sheet given to you by the facility where your surgery was performed. IF YOU HAVE DISABILITY OR FAMILY LEAVE FORMS, YOU MUST BRING THEM TO THE OFFICE FOR PROCESSING.   DO NOT GIVE THEM TO YOUR DOCTOR.  PAIN CONTROL  1. First take acetaminophen (Tylenol) AND/or ibuprofen (Advil) to control your pain after surgery.  Follow directions on package.  Taking acetaminophen (Tylenol) and/or ibuprofen (Advil) regularly after surgery will help to control your pain and lower the amount of prescription pain medication you may need.  You should not take more than 4,000 mg (4 grams) of acetaminophen (Tylenol) in 24 hours.  You should not take ibuprofen (Advil), aleve, motrin, naprosyn or other NSAIDS if you have a history of stomach ulcers or chronic kidney disease.  2. A prescription for pain medication may be given to you upon discharge.  Take your pain medication as prescribed, if you still have uncontrolled pain after taking acetaminophen (Tylenol) or ibuprofen (Advil). 3. Use ice packs to help control pain. 4. If you need a refill on your pain medication, please contact your pharmacy.  They will contact our office to request authorization. Prescriptions will not be filled after 5pm or on week-ends.  HOME MEDICATIONS 5. Take your usually prescribed medications unless otherwise directed.  DIET 6. You should follow a light diet the first few days after arrival home.  Be sure to include lots of fluids daily. Avoid fatty, fried foods.   CONSTIPATION 7. It is common to experience some constipation after surgery and if you are taking pain medication.  Increasing fluid intake and taking a stool softener (such as Colace) will usually help or prevent this problem from occurring.  A mild laxative (Milk of Magnesia or Miralax) should be taken according to package instructions if there are no bowel  movements after 48 hours.  WOUND/INCISION CARE 8. Most patients will experience some swelling and bruising in the area of the incisions.  Ice packs will help.  Swelling and bruising can take several days to resolve.  9. Unless discharge instructions indicate otherwise, follow guidelines below  a. STERI-STRIPS - you may remove your outer bandages 48 hours after surgery, and you may shower at that time.  You have steri-strips (small skin tapes) in place directly over the incision.  These strips should be left on the skin for 7-10 days.   b. DERMABOND/SKIN GLUE - you may shower in 24 hours.  The glue will flake off over the next 2-3 weeks. 10. Any sutures or staples will be removed at the office during your follow-up visit.  ACTIVITIES 11. You may resume regular (light) daily activities beginning the next day--such as daily self-care, walking, climbing stairs--gradually increasing activities as tolerated.  You may have sexual intercourse when it is comfortable.  Refrain from any heavy lifting or straining until approved by your doctor. a. You may drive when you are no longer taking prescription pain medication, you can comfortably wear a seatbelt, and you can safely maneuver your car and apply brakes.  FOLLOW-UP 12. You should see your doctor in the office for a follow-up appointment approximately 2-3 weeks after your surgery.  You should have been given your post-op/follow-up appointment when your surgery was scheduled.  If you did not receive a post-op/follow-up appointment, make sure that you call for this appointment within a day or two after you arrive home to insure a convenient appointment time.  OTHER INSTRUCTIONS  WHEN TO CALL YOUR DOCTOR: 1. Fever over 101.0 2. Inability to urinate 3. Continued bleeding from incision. 4. Increased pain, redness, or drainage from the incision. 5. Increasing abdominal pain  The clinic staff is available to answer your questions during regular business  hours.  Please dont hesitate to call and ask to speak to one of the nurses for clinical concerns.  If you have a medical emergency, go to the nearest emergency room or call 911.  A surgeon from [facility] is always on call at the hospital. [address] [place], [place], [place]  [address] ? [address], [place], [place]   [address] [phone] ? [phone] ? FAX [phone]

## 2019-01-19 NOTE — Anesthesia Preprocedure Evaluation (Signed)
Anesthesia Evaluation  Patient identified by MRN, date of birth, ID band Patient awake    Reviewed: Allergy & Precautions, NPO status , Patient's Chart, lab work & pertinent test results  History of Anesthesia Complications Negative for: history of anesthetic complications  Airway Mallampati: II  TM Distance: >3 FB Neck ROM: Full    Dental  (+) Caps, Teeth Intact   Pulmonary sleep apnea ,    breath sounds clear to auscultation       Cardiovascular negative cardio ROS   Rhythm:Regular     Neuro/Psych  Headaches,  Neuromuscular disease negative psych ROS   GI/Hepatic Neg liver ROS, GERD  Controlled,appendicitis   Endo/Other  negative endocrine ROS  Renal/GU negative Renal ROS     Musculoskeletal negative musculoskeletal ROS (+)   Abdominal   Peds  Hematology negative hematology ROS (+)   Anesthesia Other Findings   Reproductive/Obstetrics                             Anesthesia Physical Anesthesia Plan  ASA: II  Anesthesia Plan: General   Post-op Pain Management:    Induction: Intravenous and Rapid sequence  PONV Risk Score and Plan: 3 and Dexamethasone and Ondansetron  Airway Management Planned: Oral ETT  Additional Equipment: None  Intra-op Plan:   Post-operative Plan: Extubation in OR  Informed Consent: I have reviewed the patients History and Physical, chart, labs and discussed the procedure including the risks, benefits and alternatives for the proposed anesthesia with the patient or authorized representative who has indicated his/her understanding and acceptance.     Dental advisory given  Plan Discussed with: CRNA and Surgeon  Anesthesia Plan Comments:         Anesthesia Quick Evaluation

## 2019-01-19 NOTE — Op Note (Signed)
Appendectomy, Lap, Procedure Note  Indications: The patient presented with a history of right-sided abdominal pain. A CT and exam  revealed findings consistent with acute appendicitis. COVID risk discussed with the patient and other options discussed.The procedure has been discussed with the patient.  Alternative therapies have been discussed with the patient.  Operative risks include bleeding,  Infection,  Organ injury,  Nerve injury,  Blood vessel injury,  DVT,  Pulmonary embolism,  Death,  And possible reoperation.  Medical management risks include worsening of present situation.  The success of the procedure is 50 -90 % at treating patients symptoms.  The patient understands and agrees to proceed.  Pre-operative Diagnosis: Acute appendicitis without mention of peritonitis  Post-operative Diagnosis: Same  Surgeon: Turner Daniels  MD  Assistants: none   Anesthesia: General endotracheal anesthesia and Local anesthesia 0.25.% bupivacaine, with epinephrine  ASA Class: 1  Procedure Details  The patient was seen again in the Holding Room. The risks, benefits, complications, treatment options, and expected outcomes were discussed with the patient and/or family. The possibilities of reaction to medication, pulmonary aspiration, perforation of viscus, bleeding, recurrent infection, finding a normal appendix, the need for additional procedures, failure to diagnose a condition, and creating a complication requiring transfusion or operation were discussed. There was concurrence with the proposed plan and informed consent was obtained. The site of surgery was properly noted/marked. The patient was taken to Operating Room, identified as Nicole [name] and the procedure verified as Appendectomy. A Time Out was held and the above information confirmed.  The patient was placed in the supine position and general anesthesia was induced, along with placement of orogastric tube, Venodyne boots, and a Foley  catheter. The abdomen was prepped and draped in a sterile fashion. A one centimeter infraumbilical incision was made and the peritoneal cavity was accessed using the OPEN  technique. The pneumoperitoneum was then established to steady pressure of 12 mmHg. A 12 mm port was placed through the umbilical incision. Additional 5 mm cannulas then placed in the left lower quadrant of the abdomen and half way between the umbilicus and xyphoid under direct vision. A careful evaluation of the entire abdomen was carried out. The patient was placed in Trendelenburg and left lateral decubitus position. The small intestines were retracted in the cephalad and left lateral direction away from the pelvis and right lower quadrant. The patient was found to have an enlarged and inflamed appendix that was extending into the pelvis. There was no evidence of perforation.  The appendix was carefully dissected. A window was made in the mesoappendix at the base of the appendix. A harmonic scalpel was used across the mesoappendix. The appendix was divided at its base using an endo-GIA stapler. Minimal appendiceal stump was left in place. There was no evidence of bleeding, leakage, or complication after division of the appendix. Irrigation was also performed and irrigate suctioned from the abdomen as well. Appendix removed with laparoscopic bag.   The umbilical port site was closed using 0 vicryl pursestring sutures fashion at the level of the fascia. The trocar site skin wounds were closed using skin staples.  Instrument, sponge, and needle counts were correct at the conclusion of the case.   Findings: The appendix was found to be inflamed. There were not signs of necrosis.  There was not perforation. There was not abscess formation.  Estimated Blood Loss:  less than 50 mL         Drains: none  Total IV Fluids: per record          Specimens: appendix         Complications:  None; patient tolerated the procedure  well.         Disposition: PACU - hemodynamically stable.         Condition: stable

## 2019-01-19 NOTE — Interval H&P Note (Signed)
History and Physical Interval Note:  01/19/2019 8:32 AM  Nicole [name]  has presented today for surgery, with the diagnosis of appendicitis.  The various methods of treatment have been discussed with the patient and family. After consideration of risks, benefits and other options for treatment, the patient has consented to  Procedure(s): APPENDECTOMY LAPAROSCOPIC (N/A) as a surgical intervention.  The patient's history has been reviewed, patient examined, no change in status, stable for surgery.  I have reviewed the patient's chart and labs.  Questions were answered to the patient's satisfaction.   Discussed need for MRI as outpatient for small adrenal mass which appears as adenoma.      [place]

## 2019-01-19 NOTE — Discharge Summary (Signed)
Patient ID: Nicole [name] [id number] 16-Aug-1966 53 y.o.  Admit date: 01/18/2019 Discharge date: 01/19/2019  Admitting Diagnosis: Acute appendicitis  Left adrenal mass  Discharge Diagnosis Patient Active Problem List   Diagnosis Date Noted  . 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 Acute appendicitis 01/18/2019  . Strain of left quadriceps muscle 06/19/2018  . Patellofemoral syndrome, left 06/19/2018  . Greater trochanteric bursitis of both hips 11/08/2017    Consultants None  H&P 53 yo wf developed abd bloating late yesterday followed by abd pain today. No similar symptoms. No n/v/f/c. Pain in RLQ. Was able to eat lunch today but came to ED bc persistent pain. Had BM earlier today. Has had vag hysterectomy and lap procedures on ovaries. No prior dvt. She was found to have appendicitis on CT.   Procedures Laparoscopic Appendectomy, Dr. Erroll Luna, 01/19/2019  Hospital Course:  Nicole [name] is 53 y.o. female who presented for abdominal pain as above and was found to have acute appendicitis. The patient was admitted and underwent a laparoscopic appendectomy.  The patient tolerated the procedure well.  On POD 0, the patient was tolerating a diet, voiding well, mobilizing, and pain was controlled with oral pain medications.  The patient was stable for DC home at this time with appropriate follow up made.  To note, on CT scan, patient was incidentally found to have an indeterminate left adrenal mass measuring up to 29 mm. Recommendations per radiology are for outpatient MRI. Discussed with patient who states understanding. She is to follow up with pcp to arrange imaging. I will forward the discharge summary to the patient's PCP.   Physical Exam: Gen: Awake and alert, NAD Lungs: Normal effort Abd: Soft, ND, appropriately tender, +BS. Incision c/d/i with dermabond overlying.   Allergies as of 01/19/2019      Reactions   Bee Venom       Medication List    TAKE these medications    acetaminophen 500 MG tablet Commonly known as:  TYLENOL Take 2 tablets (1,000 mg total) by mouth every 6 (six) hours as needed.   ibuprofen 600 MG tablet Commonly known as:  ADVIL Take 1 tablet (600 mg total) by mouth every 6 (six) hours as needed for mild pain.   ondansetron 4 MG disintegrating tablet Commonly known as:  ZOFRAN-ODT Take 1 tablet (4 mg total) by mouth every 6 (six) hours as needed for nausea.   oxyCODONE 5 MG immediate release tablet Commonly known as:  Oxy IR/ROXICODONE Take 1 tablet (5 mg total) by mouth every 6 (six) hours as needed.   Vitamin D (Ergocalciferol) 1.25 MG (50000 UT) Caps capsule Commonly known as:  DRISDOL TAKE 1 CAPSULE (50,000 UNITS TOTAL) BY MOUTH EVERY 7 (SEVEN) DAYS.   Vivelle-Dot 0.075 MG/24HR Generic drug:  estradiol Place 1 patch onto the skin 2 (two) times a week.        Follow-up [facility], [place]. Call on 02/01/2019.   Specialty:  General Surgery Why:  5/21 at 2:30pm. Due to coronavirus we are decreasing foot traffic in office. Instead of coming to an appt a provider will call you at the above date/time. Send picture of your incision with your name and date of birth to [email or web address] Contact information: [address] [place] [phone]       London Pepper, MD Follow up.   Specialty:  Family Medicine Why:  Please call your PCP to arrange a  follow up for an MRI. Your CT scan showed a incidental left adrenal mass measuring up to 29 mm that was recommended for an MRI follow up on a outpatient basis.  Contact information: [address] [place]  [address] [phone]           Signed: Alferd Apa, [facility] 01/19/2019, 4:29 PM Pager: [phone]

## 2019-01-21 NOTE — Anesthesia Postprocedure Evaluation (Signed)
Anesthesia Post Note  Patient: Aris Georgia  Procedure(s) Performed: APPENDECTOMY LAPAROSCOPIC (N/A Abdomen)     Patient location during evaluation: PACU Anesthesia Type: General Level of consciousness: awake and alert Pain management: pain level controlled Vital Signs Assessment: post-procedure vital signs reviewed and stable Respiratory status: spontaneous breathing, nonlabored ventilation, respiratory function stable and patient connected to nasal cannula oxygen Cardiovascular status: blood pressure returned to baseline and stable Postop Assessment: no apparent nausea or vomiting Anesthetic complications: no    Last Vitals:  Vitals:   01/19/19 1110 01/19/19 1335  BP: (!) 109/51 (!) 101/58  Pulse: 69 64  Resp: 18   Temp: 36.6 C 36.6 C  SpO2: 98% 98%    Last Pain:  Vitals:   01/19/19 1544  TempSrc:   PainSc: 0-No pain                 Bernell Haynie

## 2019-01-22 ENCOUNTER — Encounter (HOSPITAL_COMMUNITY): Payer: Self-pay | Admitting: Surgery

## 2019-03-08 ENCOUNTER — Other Ambulatory Visit: Payer: Self-pay | Admitting: Family Medicine

## 2019-03-08 DIAGNOSIS — E279 Disorder of adrenal gland, unspecified: Secondary | ICD-10-CM

## 2019-04-02 ENCOUNTER — Other Ambulatory Visit: Payer: Self-pay

## 2019-04-02 ENCOUNTER — Ambulatory Visit
Admission: RE | Admit: 2019-04-02 | Discharge: 2019-04-02 | Disposition: A | Discharge status: Home / Self Care | Payer: 59 | Source: Ambulatory Visit | Attending: Family Medicine | Admitting: Family Medicine

## 2019-04-02 DIAGNOSIS — E279 Disorder of adrenal gland, unspecified: Secondary | ICD-10-CM

## 2019-04-02 MED ORDER — GADOBENATE DIMEGLUMINE 529 MG/ML IV SOLN
15.0000 mL | Freq: Once | INTRAVENOUS | Status: AC | PRN
  Administered 2019-04-02: 15 mL via INTRAVENOUS

## 2019-05-29 NOTE — Progress Notes (Signed)
[name] [place], [place] [address] Phone: [phone] Subjective:   Nicole [name], am serving as a scribe for Dr. Hulan Saas.   CC: Hip pain follow-up  [id number]   06/19/2018 This is associated with an avulsion.  Discussed vitamin D, compression, avoiding resisted knee extension for now.  Discussed icing regimen, home exercises, worsening symptoms may need further evaluation with x-rays and potentially advanced imaging.  Differential includes hip pathology but patient actually has fairly good range of motion.  Follow-up again in 4 weeks  Update 05/30/2019 Nicole [name] is a 53 y.o. female coming in with complaint of right GT pain. Pain with lying on that side at night. Has had left hip pain that was alleviated by an injection.  Patient states that both of the hips are giving her some difficulty.  Some mild back pain as well.  Nothing severe though.  States that sometimes it is bad enough [name] wake her up at night and has stopped her from working out on a regular basis.     Past Medical History:  Diagnosis Date  . GERD (gastroesophageal reflux disease)    diet controlled - [name] meds  . Headache   . Sleep apnea    mild - dental appliance at night  . SVD (spontaneous vaginal delivery)    x 2   Past Surgical History:  Procedure Laterality Date  . ABDOMINAL HYSTERECTOMY    . COLONOSCOPY    . DIAGNOSTIC LAPAROSCOPY    . LAPAROSCOPIC APPENDECTOMY N/A 01/19/2019   Procedure: APPENDECTOMY LAPAROSCOPIC;  Surgeon: Erroll Luna, MD;  Location: [facility];  Service: General;  Laterality: N/A;  . SHOULDER SURGERY Left 2006   Social History   Socioeconomic History  . Marital status: Married    Spouse name: Not on file  . Number of children: Not on file  . Years of education: Not on file  . Highest education level: Not on file  Occupational History  . Not on file  Social Needs  . Financial resource strain: Not on file  . Food  insecurity    Worry: Not on file    Inability: Not on file  . Transportation needs    Medical: Not on file    Non-medical: Not on file  Tobacco Use  . Smoking status: Never Smoker  . Smokeless tobacco: Never Used  Substance and Sexual Activity  . Alcohol use: Yes  . Drug use: Never  . Sexual activity: Not on file  Lifestyle  . Physical activity    Days per week: Not on file    Minutes per session: Not on file  . Stress: Not on file  Relationships  . Social Herbalist on phone: Not on file    Gets together: Not on file    Attends religious service: Not on file    Active member of club or organization: Not on file    Attends meetings of clubs or organizations: Not on file    Relationship status: Not on file  Other Topics Concern  . Not on file  Social History Narrative  . Not on file   Allergies  Allergen Reactions  . Bee Venom    Family History  Problem Relation Age of Onset  . Breast cancer Mother 17    Current Outpatient Medications (Endocrine & Metabolic):  Marland Kitchen  VIVELLE-DOT 0.075 MG/24HR, Place 1 patch onto the skin 2 (two) times a week.  Current Outpatient Medications (Analgesics):  .  acetaminophen (TYLENOL) 500 MG tablet, Take 2 tablets (1,000 mg total) by mouth every 6 (six) hours as needed. Marland Kitchen  ibuprofen (ADVIL) 600 MG tablet, Take 1 tablet (600 mg total) by mouth every 6 (six) hours as needed for mild pain. Marland Kitchen  oxyCODONE (OXY IR/ROXICODONE) 5 MG immediate release tablet, Take 1 tablet (5 mg total) by mouth every 6 (six) hours as needed.   Current Outpatient Medications (Other):  .  ondansetron (ZOFRAN-ODT) 4 MG disintegrating tablet, Take 1 tablet (4 mg total) by mouth every 6 (six) hours as needed for nausea. .  Vitamin D, Ergocalciferol, (DRISDOL) 1.25 MG (50000 UT) CAPS capsule, TAKE 1 CAPSULE (50,000 UNITS TOTAL) BY MOUTH EVERY 7 (SEVEN) DAYS.    Past medical history, social, surgical and family history all reviewed in electronic medical  record.  [name] pertanent information unless stated regarding [name] the chief complaint.   Review of Systems:  [name] headache, visual changes, nausea, vomiting, diarrhea, constipation, dizziness, abdominal pain, skin rash, fevers, chills, night sweats, weight loss, swollen lymph nodes, body aches, joint swelling, muscle aches, chest pain, shortness of breath, mood changes.   Objective  There were [name] vitals taken for this visit. Systems examined below as of    General: [name] apparent distress alert and oriented x3 mood and affect normal, dressed appropriately.  HEENT: Pupils equal, extraocular movements intact  Respiratory: Patient's speak in full sentences and does not appear short of breath  Cardiovascular: [name] lower extremity edema, non tender, [name] erythema  Skin: Warm dry intact with [name] signs of infection or rash on extremities or on axial skeleton.  Abdomen: Soft nontender  Neuro: Cranial nerves II through XII are intact, neurovascularly intact in all extremities with 2+ DTRs and 2+ pulses.  Lymph: [name] lymphadenopathy of posterior or anterior cervical chain or axillae bilaterally.  Gait normal with good balance and coordination.  MSK:  Non tender with full range of motion and good stability and symmetric strength and tone of shoulders, elbows, wrist, hip, knee and ankles bilaterally.  Bilateral hip exam shows the patient does have severe tenderness [name] palpation in the paraspinal musculature more of the greater trochanteric area but some in the lower back as well.  Negative straight leg test.  Positive Faber test bilaterally.  5-5 strength in lower extremities for 4 out of 5 strength of the hip abductors but symmetric   Procedure: Real-time Ultrasound Guided Injection of right greater trochanteric bursitis secondary [name] patient's body habitus Device: GE Logiq Q7 Ultrasound guided injection is preferred based studies that show increased duration, increased effect, greater accuracy, decreased procedural  pain, increased response rate, and decreased cost with ultrasound guided versus blind injection.  Verbal informed consent obtained.  Time-out conducted.  Noted [name] overlying erythema, induration, or other signs of local infection.  Skin prepped in a sterile fashion.  Local anesthesia: Topical Ethyl chloride.  With sterile technique and under real time ultrasound guidance:  Greater trochanteric area was visualized and patient's bursa was noted. A 22-gauge 3 inch needle was inserted and 4 cc of 0.5% Marcaine and 1 cc of Kenalog 40 mg/dL was injected. Pictures taken Completed without difficulty  Pain immediately resolved suggesting accurate placement of the medication.  Advised [name] call if fevers/chills, erythema, induration, drainage, or persistent bleeding.  Images permanently stored and available for review in the ultrasound unit.  Impression: Technically successful ultrasound guided injection.   Procedure: Real-time Ultrasound Guided Injection of left  greater trochanteric bursitis  secondary [name] patient's body habitus Device: GE Logiq Q7  Ultrasound guided injection is preferred based studies that show increased duration, increased effect, greater accuracy, decreased procedural pain, increased response rate, and decreased cost with ultrasound guided versus blind injection.  Verbal informed consent obtained.  Time-out conducted.  Noted [name] overlying erythema, induration, or other signs of local infection.  Skin prepped in a sterile fashion.  Local anesthesia: Topical Ethyl chloride.  With sterile technique and under real time ultrasound guidance:  Greater trochanteric area was visualized and patient's bursa was noted. A 22-gauge 3 inch needle was inserted and 4 cc of 0.5% Marcaine and 1 cc of Kenalog 40 mg/dL was injected. Pictures taken Completed without difficulty  Pain immediately resolved suggesting accurate placement of the medication.  Advised [name] call if fevers/chills, erythema,  induration, drainage, or persistent bleeding.  Images permanently stored and available for review in the ultrasound unit.  Impression: Technically successful ultrasound guided injection.    Impression and Recommendations:     This case required medical decision making of moderate complexity. The above documentation has been reviewed and is accurate and complete Lyndal Pulley, DO       Note: This dictation was prepared with Dragon dictation along with smaller phrase technology. Any transcriptional errors that result from this process are unintentional.

## 2019-05-30 ENCOUNTER — Other Ambulatory Visit: Payer: Self-pay

## 2019-05-30 ENCOUNTER — Ambulatory Visit (INDEPENDENT_AMBULATORY_CARE_PROVIDER_SITE_OTHER): Payer: 59 | Admitting: Family Medicine

## 2019-05-30 ENCOUNTER — Ambulatory Visit: Payer: Self-pay

## 2019-05-30 VITALS — BP 110/82 | HR 59 | Ht 62.0 in | Wt 166.0 lb

## 2019-05-30 DIAGNOSIS — M25551 Pain in right hip: Secondary | ICD-10-CM

## 2019-05-30 DIAGNOSIS — M7061 Trochanteric bursitis, right hip: Secondary | ICD-10-CM

## 2019-05-30 DIAGNOSIS — M7062 Trochanteric bursitis, left hip: Secondary | ICD-10-CM | POA: Diagnosis not present

## 2019-05-30 NOTE — Patient Instructions (Addendum)
Good to see you.  Ice 20 minutes 2 times daily. Usually after activity and before bed. Injected both hips today

## 2019-05-30 NOTE — Assessment & Plan Note (Signed)
Bilateral injections given today.  Discussed icing regimen and home exercises.  Discussed which activities of doing which wants to avoid.  Patient should increase activity as tolerated.  Follow-up again in 4 to 6 weeks

## 2019-07-19 ENCOUNTER — Other Ambulatory Visit: Payer: Self-pay | Admitting: Orthopedic Surgery

## 2019-09-12 ENCOUNTER — Other Ambulatory Visit: Payer: Self-pay | Admitting: *Deleted

## 2019-09-12 DIAGNOSIS — Z20822 Contact with and (suspected) exposure to covid-19: Secondary | ICD-10-CM

## 2019-09-13 LAB — NOVEL CORONAVIRUS, NAA: SARS-CoV-2, NAA: NOT DETECTED

## 2019-10-19 ENCOUNTER — Other Ambulatory Visit: Payer: Self-pay | Admitting: Obstetrics & Gynecology

## 2019-10-19 DIAGNOSIS — Z1231 Encounter for screening mammogram for malignant neoplasm of breast: Secondary | ICD-10-CM

## 2019-11-03 ENCOUNTER — Other Ambulatory Visit: Payer: Self-pay | Admitting: Internal Medicine

## 2019-11-03 DIAGNOSIS — D3502 Benign neoplasm of left adrenal gland: Secondary | ICD-10-CM

## 2019-11-20 ENCOUNTER — Ambulatory Visit
Admission: RE | Admit: 2019-11-20 | Discharge: 2019-11-20 | Disposition: A | Discharge status: Home / Self Care | Payer: 59 | Source: Ambulatory Visit | Attending: Obstetrics & Gynecology | Admitting: Obstetrics & Gynecology

## 2019-11-20 ENCOUNTER — Other Ambulatory Visit: Payer: Self-pay

## 2019-11-20 DIAGNOSIS — Z1231 Encounter for screening mammogram for malignant neoplasm of breast: Secondary | ICD-10-CM

## 2020-03-25 ENCOUNTER — Other Ambulatory Visit: Payer: Self-pay

## 2020-03-25 ENCOUNTER — Ambulatory Visit
Admission: RE | Admit: 2020-03-25 | Discharge: 2020-03-25 | Disposition: A | Discharge status: Home / Self Care | Payer: 59 | Source: Ambulatory Visit | Attending: Internal Medicine | Admitting: Internal Medicine

## 2020-03-25 DIAGNOSIS — D3502 Benign neoplasm of left adrenal gland: Secondary | ICD-10-CM

## 2020-03-25 MED ORDER — GADOBENATE DIMEGLUMINE 529 MG/ML IV SOLN
15.0000 mL | Freq: Once | INTRAVENOUS | Status: AC | PRN
  Administered 2020-03-25: 15 mL via INTRAVENOUS

## 2020-10-14 ENCOUNTER — Other Ambulatory Visit: Payer: Self-pay | Admitting: Obstetrics & Gynecology

## 2020-10-14 DIAGNOSIS — Z1231 Encounter for screening mammogram for malignant neoplasm of breast: Secondary | ICD-10-CM

## 2020-11-11 DIAGNOSIS — Z1231 Encounter for screening mammogram for malignant neoplasm of breast: Secondary | ICD-10-CM

## 2020-11-27 ENCOUNTER — Other Ambulatory Visit: Payer: Self-pay

## 2020-11-27 ENCOUNTER — Ambulatory Visit
Admission: RE | Admit: 2020-11-27 | Discharge: 2020-11-27 | Disposition: A | Discharge status: Home / Self Care | Payer: BC Managed Care – PPO | Source: Ambulatory Visit | Attending: Obstetrics & Gynecology | Admitting: Obstetrics & Gynecology

## 2020-11-27 DIAGNOSIS — Z1231 Encounter for screening mammogram for malignant neoplasm of breast: Secondary | ICD-10-CM

## 2020-12-04 DIAGNOSIS — Z6830 Body mass index (BMI) 30.0-30.9, adult: Secondary | ICD-10-CM | POA: Diagnosis not present

## 2020-12-04 DIAGNOSIS — D3502 Benign neoplasm of left adrenal gland: Secondary | ICD-10-CM | POA: Diagnosis not present

## 2020-12-04 DIAGNOSIS — Z01419 Encounter for gynecological examination (general) (routine) without abnormal findings: Secondary | ICD-10-CM | POA: Diagnosis not present

## 2021-03-26 DIAGNOSIS — E559 Vitamin D deficiency, unspecified: Secondary | ICD-10-CM | POA: Diagnosis not present

## 2021-03-26 DIAGNOSIS — R7301 Impaired fasting glucose: Secondary | ICD-10-CM | POA: Diagnosis not present

## 2021-03-26 DIAGNOSIS — E785 Hyperlipidemia, unspecified: Secondary | ICD-10-CM | POA: Diagnosis not present

## 2021-03-26 DIAGNOSIS — Z0000 Encounter for general adult medical examination without abnormal findings: Secondary | ICD-10-CM | POA: Diagnosis not present

## 2021-03-31 DIAGNOSIS — U071 COVID-19: Secondary | ICD-10-CM | POA: Diagnosis not present

## 2021-07-10 DIAGNOSIS — D225 Melanocytic nevi of trunk: Secondary | ICD-10-CM | POA: Diagnosis not present

## 2021-07-10 DIAGNOSIS — L570 Actinic keratosis: Secondary | ICD-10-CM | POA: Diagnosis not present

## 2021-07-10 DIAGNOSIS — L821 Other seborrheic keratosis: Secondary | ICD-10-CM | POA: Diagnosis not present

## 2021-07-10 DIAGNOSIS — L720 Epidermal cyst: Secondary | ICD-10-CM | POA: Diagnosis not present

## 2021-10-20 ENCOUNTER — Other Ambulatory Visit: Payer: Self-pay | Admitting: Obstetrics & Gynecology

## 2021-10-20 DIAGNOSIS — Z1231 Encounter for screening mammogram for malignant neoplasm of breast: Secondary | ICD-10-CM

## 2021-10-21 ENCOUNTER — Other Ambulatory Visit: Payer: Self-pay | Admitting: Obstetrics & Gynecology

## 2021-10-21 ENCOUNTER — Ambulatory Visit: Payer: Self-pay

## 2021-10-21 DIAGNOSIS — Z1231 Encounter for screening mammogram for malignant neoplasm of breast: Secondary | ICD-10-CM

## 2021-10-28 ENCOUNTER — Other Ambulatory Visit: Payer: Self-pay

## 2021-10-28 ENCOUNTER — Ambulatory Visit
Admission: RE | Admit: 2021-10-28 | Discharge: 2021-10-28 | Disposition: A | Discharge status: Home / Self Care | Payer: BC Managed Care – PPO | Source: Ambulatory Visit

## 2021-10-28 DIAGNOSIS — Z1231 Encounter for screening mammogram for malignant neoplasm of breast: Secondary | ICD-10-CM | POA: Diagnosis not present

## 2021-11-05 DIAGNOSIS — Z1272 Encounter for screening for malignant neoplasm of vagina: Secondary | ICD-10-CM | POA: Diagnosis not present

## 2021-11-05 DIAGNOSIS — Z01419 Encounter for gynecological examination (general) (routine) without abnormal findings: Secondary | ICD-10-CM | POA: Diagnosis not present

## 2021-11-05 DIAGNOSIS — Z6832 Body mass index (BMI) 32.0-32.9, adult: Secondary | ICD-10-CM | POA: Diagnosis not present

## 2022-03-22 DIAGNOSIS — T63441A Toxic effect of venom of bees, accidental (unintentional), initial encounter: Secondary | ICD-10-CM | POA: Diagnosis not present

## 2022-04-01 DIAGNOSIS — Z23 Encounter for immunization: Secondary | ICD-10-CM | POA: Diagnosis not present

## 2022-04-01 DIAGNOSIS — Z0000 Encounter for general adult medical examination without abnormal findings: Secondary | ICD-10-CM | POA: Diagnosis not present

## 2022-04-01 DIAGNOSIS — R7301 Impaired fasting glucose: Secondary | ICD-10-CM | POA: Diagnosis not present

## 2022-04-01 DIAGNOSIS — E785 Hyperlipidemia, unspecified: Secondary | ICD-10-CM | POA: Diagnosis not present

## 2022-04-01 DIAGNOSIS — E559 Vitamin D deficiency, unspecified: Secondary | ICD-10-CM | POA: Diagnosis not present

## 2022-04-13 ENCOUNTER — Ambulatory Visit: Payer: BC Managed Care – PPO | Admitting: Family Medicine

## 2022-04-14 NOTE — Progress Notes (Unsigned)
Nicole [name] [address] [place] Phone: [phone] Subjective:   IVilma [name], am serving as a scribe for Dr. Hulan Saas.  I'm seeing this patient by the request  of:  Nicole Pepper, MD  CC: Foot pain and bilateral hip pain  [id number]  Nicole [name] is a 56 y.o. female coming in with complaint of R hip and R foot pain. Patient last seen in 2020 for GT bursitis. Patient states thinks she has plantar fascitis in right foot. Bursitis in both hips causing a bit of pain. In the foot the pain is located in the heel. Splint at night has been causing pain on dorsal side of foot.      Past Medical History:  Diagnosis Date   GERD (gastroesophageal reflux disease)    diet controlled - no meds   Headache    Sleep apnea    mild - dental appliance at night   SVD (spontaneous vaginal delivery)    x 2   Past Surgical History:  Procedure Laterality Date   ABDOMINAL HYSTERECTOMY     COLONOSCOPY     DIAGNOSTIC LAPAROSCOPY     LAPAROSCOPIC APPENDECTOMY N/A 01/19/2019   Procedure: APPENDECTOMY LAPAROSCOPIC;  Surgeon: Erroll Luna, MD;  Location: [facility];  Service: General;  Laterality: N/A;   SHOULDER SURGERY Left 2006   Social History   Socioeconomic History   Marital status: Married    Spouse name: Not on file   Number of children: Not on file   Years of education: Not on file   Highest education level: Not on file  Occupational History   Not on file  Tobacco Use   Smoking status: Never   Smokeless tobacco: Never  Vaping Use   Vaping Use: Never used  Substance and Sexual Activity   Alcohol use: Yes   Drug use: Never   Sexual activity: Not on file  Other Topics Concern   Not on file  Social History Narrative   Not on file   Social Determinants of Health   Financial Resource Strain: Not on file  Food Insecurity: Not on file  Transportation Needs: Not on file  Physical Activity: Not on file  Stress: Not on file   Social Connections: Not on file   Allergies  Allergen Reactions   Bee Venom    Family History  Problem Relation Age of Onset   Breast cancer Mother 70    Current Outpatient Medications (Endocrine & Metabolic):    VIVELLE-DOT 0.075 MG/24HR, Place 1 patch onto the skin 2 (two) times a week.    Current Outpatient Medications (Analgesics):    acetaminophen (TYLENOL) 500 MG tablet, Take 2 tablets (1,000 mg total) by mouth every 6 (six) hours as needed.   ibuprofen (ADVIL) 600 MG tablet, Take 1 tablet (600 mg total) by mouth every 6 (six) hours as needed for mild pain.   oxyCODONE (OXY IR/ROXICODONE) 5 MG immediate release tablet, Take 1 tablet (5 mg total) by mouth every 6 (six) hours as needed.   Current Outpatient Medications (Other):    ondansetron (ZOFRAN-ODT) 4 MG disintegrating tablet, Take 1 tablet (4 mg total) by mouth every 6 (six) hours as needed for nausea.   Vitamin D, Ergocalciferol, (DRISDOL) 1.25 MG (50000 UT) CAPS capsule, TAKE 1 CAPSULE (50,000 UNITS TOTAL) BY MOUTH EVERY 7 (SEVEN) DAYS.   Reviewed prior external information including notes and imaging from  primary care provider As well as notes that were available  from care everywhere and other healthcare systems.  Past medical history, social, surgical and family history all reviewed in electronic medical record.  No pertanent information unless stated regarding to the chief complaint.   Review of Systems:  No headache, visual changes, nausea, vomiting, diarrhea, constipation, dizziness, abdominal pain, skin rash, fevers, chills, night sweats, weight loss, swollen lymph nodes, body aches, joint swelling, chest pain, shortness of breath, mood changes. POSITIVE muscle aches  Objective  Blood pressure 102/72, pulse (!) 57, height '5\' 2"'$  (1.575 m), weight 170 lb (77.1 kg), SpO2 97 %.   General: No apparent distress alert and oriented x3 mood and affect normal, dressed appropriately.  HEENT: Pupils equal,  extraocular movements intact  Respiratory: Patient's speak in full sentences and does not appear short of breath  Cardiovascular: No lower extremity edema, non tender, no erythema  Foot exam shows the patient does have some mild breakdown of the longitudinal arch.  Mild overpronation noted.  Tender to palpation over the calcaneal area plantar aspect.  Bilateral hips Tightness noted.  Severe tenderness to palpation over the greater trochanteric area right greater than left.  Tightness with FABER test on the right  Limited muscular skeletal ultrasound was performed and interpreted by Hulan Saas, M  Limited ultrasound shows patient's calcaneal area and patient does have hypoechoic changes with increasing in diameter of the plantar fascia noted.  No internal intersubstance hypoechoic changes noted. Impression: Plantar fasciitis    Impression and Recommendations:    The above documentation has been reviewed and is accurate and complete Lyndal Pulley, DO

## 2022-04-15 ENCOUNTER — Ambulatory Visit (INDEPENDENT_AMBULATORY_CARE_PROVIDER_SITE_OTHER): Payer: BC Managed Care – PPO

## 2022-04-15 ENCOUNTER — Ambulatory Visit (INDEPENDENT_AMBULATORY_CARE_PROVIDER_SITE_OTHER): Payer: BC Managed Care – PPO | Admitting: Family Medicine

## 2022-04-15 ENCOUNTER — Ambulatory Visit: Payer: Self-pay

## 2022-04-15 VITALS — BP 102/72 | HR 57 | Ht 62.0 in | Wt 170.0 lb

## 2022-04-15 DIAGNOSIS — M7062 Trochanteric bursitis, left hip: Secondary | ICD-10-CM

## 2022-04-15 DIAGNOSIS — S3993XA Unspecified injury of pelvis, initial encounter: Secondary | ICD-10-CM | POA: Diagnosis not present

## 2022-04-15 DIAGNOSIS — M79671 Pain in right foot: Secondary | ICD-10-CM

## 2022-04-15 DIAGNOSIS — R102 Pelvic and perineal pain: Secondary | ICD-10-CM | POA: Diagnosis not present

## 2022-04-15 DIAGNOSIS — M7061 Trochanteric bursitis, right hip: Secondary | ICD-10-CM

## 2022-04-15 DIAGNOSIS — M25552 Pain in left hip: Secondary | ICD-10-CM | POA: Diagnosis not present

## 2022-04-15 DIAGNOSIS — M722 Plantar fascial fibromatosis: Secondary | ICD-10-CM

## 2022-04-15 NOTE — Patient Instructions (Addendum)
Ice and Voltaren Rigid bottom shoes Avoid being barefoot Tart Cherry '1200mg'$  Tumeric '500mg'$  Spenco Total Orthotics Recovery sandals in house ([place]oka or Oofos) PT Referral ordered

## 2022-04-15 NOTE — Assessment & Plan Note (Signed)
Discussed with patient again about the possibility of injections.  Patient declined them at the moment.  Feels like 3 years ago patient did not get a response but is here now.  We discussed the other possibility is that this could be lumbar radiculopathy.  Discussed different medication choices.  Patient will also start with formal physical therapy.  Patient will see how she does with strengthening of the hip abductors stretching of the hip flexors.  Follow-up with me again in 6 to 8 weeks and then discuss again about imaging and medications

## 2022-04-15 NOTE — Assessment & Plan Note (Signed)
We reviewed that stretching is critically important to the treatment of PF.  Reviewed footwear. Rigid soles have been shown to help with PF. Night splints can sometimes help. Reviewed rehab of stretching and calf raises.   Could benefit from a corticosteroid injection, orthotics, or other measures if conservative treatment fails. Follow-up again in 6 weeks and if continued pain consider injections

## 2022-04-20 DIAGNOSIS — M79671 Pain in right foot: Secondary | ICD-10-CM | POA: Diagnosis not present

## 2022-04-20 DIAGNOSIS — M7061 Trochanteric bursitis, right hip: Secondary | ICD-10-CM | POA: Diagnosis not present

## 2022-04-20 DIAGNOSIS — M7062 Trochanteric bursitis, left hip: Secondary | ICD-10-CM | POA: Diagnosis not present

## 2022-04-26 DIAGNOSIS — M79671 Pain in right foot: Secondary | ICD-10-CM | POA: Diagnosis not present

## 2022-04-26 DIAGNOSIS — M7062 Trochanteric bursitis, left hip: Secondary | ICD-10-CM | POA: Diagnosis not present

## 2022-04-26 DIAGNOSIS — M7061 Trochanteric bursitis, right hip: Secondary | ICD-10-CM | POA: Diagnosis not present

## 2022-05-07 ENCOUNTER — Ambulatory Visit (INDEPENDENT_AMBULATORY_CARE_PROVIDER_SITE_OTHER): Payer: BC Managed Care – PPO

## 2022-05-07 ENCOUNTER — Ambulatory Visit (INDEPENDENT_AMBULATORY_CARE_PROVIDER_SITE_OTHER): Payer: BC Managed Care – PPO | Admitting: Family Medicine

## 2022-05-07 ENCOUNTER — Other Ambulatory Visit: Payer: Self-pay | Admitting: Family Medicine

## 2022-05-07 ENCOUNTER — Ambulatory Visit: Payer: Self-pay

## 2022-05-07 VITALS — BP 142/86 | HR 57 | Ht 62.0 in

## 2022-05-07 DIAGNOSIS — M79671 Pain in right foot: Secondary | ICD-10-CM | POA: Diagnosis not present

## 2022-05-07 DIAGNOSIS — M25572 Pain in left ankle and joints of left foot: Secondary | ICD-10-CM

## 2022-05-07 NOTE — Patient Instructions (Addendum)
Thank you for coming in today.   Please use Voltaren gel (Generic Diclofenac Gel) up to 4x daily for pain as needed.  This is available over-the-counter as both the name brand Voltaren gel and the generic diclofenac gel.   I recommend you obtained a compression sleeve to help with your joint problems. There are many options on the market however I recommend obtaining a Full Ankle Body Helix compression sleeve.  You can find information (including how to appropriate measure yourself for sizing) can be found at www.Body http://www.lambert.com/.  Many of these products are health savings account (HSA) eligible.  You can use the compression sleeve at any time throughout the day but is most important to use while being active as well as for 2 hours post-activity.   It is appropriate to ice following activity with the compression sleeve in place.   You could use a cam walker boot if needed. [employer] or [employer].   We will add this on the PT.   Recheck as scheduled with Dr Tamala Julian.   I am happy to see you as needed anytime.

## 2022-05-07 NOTE — Progress Notes (Signed)
I, Peterson Lombard, LAT, ATC acting as a scribe for Lynne Leader, MD.  Nicole [name] is a 56 y.o. female who presents to [place] [facility] today for L ankle pain. Pt was previously seen by Dr. Tamala Julian on 04/15/22 for R plantar fascitis and bilat GT bursitis. Today, pt c/o L ankle pain ongoing since 8/15. MOI: Pt rolled her ankle into INV when she was wearing high heels, causing her to fall to the floor. Pt locates pain to medial aspect of the L ankle w/ pain previously more on the lateral aspect.  She already has established with our and rehabilitation at the [facility] for trochanteric bursitis.  L Ankle swelling: yes Treatments tried: ice, compression sleeve, elevation   Pertinent review of systems: No fevers or chills  Relevant historical information: Bilateral greater trochanteric bursitis.   Exam:  BP (!) 142/86   Pulse (!) 57   Ht '5\' 2"'$  (1.575 m)   LMP  (LMP Unknown)   SpO2 99%   BMI 31.09 kg/m  General: Well Developed, well nourished, and in no acute distress.   MSK: Left ankle: Mild swelling laterally otherwise normal. Normal ankle motion. Minimally tender palpation lateral malleolus and medial malleolus and along the course of the posterior tibialis tendon medially. Normal ankle motion. No laxity to anterior drawer testing but some laxity to talar tilt testing. Intact strength. Pulses capillary fill and sensation are intact distally.  Left knee nontender.    Lab and Radiology Results  Diagnostic Limited MSK Ultrasound of: Left ankle Mild joint effusion present at lateral ankle joint. Trace hypoechoic fluid tracks at peroneal tendon.  No visible tendon tear present. Medial joint line no joint effusion. Posterior tibialis tendon trace fluid tracks along posterior tibialis tendon just distal to the medial malleolus.  No visible tendon tear is present. Posterior ankle normal Achilles tendon. Bony structures are normal appearing Impression:  Lateral joint effusion with trace posterior tibialis and peroneal tendinitis.  X-ray images left ankle obtained today personally and independently interpreted No acute fractures are present. Trace degenerative changes visible. Await formal radiology review  Assessment and Plan: 56 y.o. female with continued left ankle pain after an ankle inversion injury.  This is thought to be primarily an ankle sprain.  She has a little bit of tendinitis as well.  Plan for physical therapy compression sleeve Voltaren gel and check back in about a month.  She already has an established scheduled visit with Dr. Tamala Julian on September 19 which she will keep.  Her emergency backup plan is a cam walker boot if needed. Prescription strength oral NSAIDs are okay temporarily.    PDMP not reviewed this encounter. Orders Placed This Encounter  Procedures   [place] LIMITED JOINT SPACE STRUCTURES LOW LEFT(NO LINKED CHARGES)    Order Specific Question:   Reason for Exam (SYMPTOM  OR DIAGNOSIS REQUIRED)    Answer:   left ankle pain    Order Specific Question:   Preferred imaging location?    Answer:   [facility]   Ambulatory referral to Physical Therapy    Referral Priority:   Routine    Referral Type:   Physical Medicine    Referral Reason:   Specialty Services Required    Requested Specialty:   Physical Therapy    Number of Visits Requested:   1   No orders of the defined types were placed in this encounter.    Discussed warning signs or symptoms. Please see discharge  instructions. Patient expresses understanding.   The above documentation has been reviewed and is accurate and complete Lynne Leader, M.D.

## 2022-05-10 NOTE — Progress Notes (Signed)
Left ankle x-ray normal to radiology.

## 2022-05-11 ENCOUNTER — Ambulatory Visit: Payer: BC Managed Care – PPO | Admitting: Family Medicine

## 2022-05-12 DIAGNOSIS — S93402D Sprain of unspecified ligament of left ankle, subsequent encounter: Secondary | ICD-10-CM | POA: Diagnosis not present

## 2022-05-12 DIAGNOSIS — M7062 Trochanteric bursitis, left hip: Secondary | ICD-10-CM | POA: Diagnosis not present

## 2022-05-12 DIAGNOSIS — M7061 Trochanteric bursitis, right hip: Secondary | ICD-10-CM | POA: Diagnosis not present

## 2022-05-12 DIAGNOSIS — M79671 Pain in right foot: Secondary | ICD-10-CM | POA: Diagnosis not present

## 2022-05-31 DIAGNOSIS — M7061 Trochanteric bursitis, right hip: Secondary | ICD-10-CM | POA: Diagnosis not present

## 2022-05-31 DIAGNOSIS — S93402D Sprain of unspecified ligament of left ankle, subsequent encounter: Secondary | ICD-10-CM | POA: Diagnosis not present

## 2022-05-31 DIAGNOSIS — M7062 Trochanteric bursitis, left hip: Secondary | ICD-10-CM | POA: Diagnosis not present

## 2022-05-31 DIAGNOSIS — M79671 Pain in right foot: Secondary | ICD-10-CM | POA: Diagnosis not present

## 2022-06-01 ENCOUNTER — Ambulatory Visit: Payer: BC Managed Care – PPO | Admitting: Family Medicine

## 2022-06-03 ENCOUNTER — Ambulatory Visit: Payer: BC Managed Care – PPO | Admitting: Family Medicine

## 2022-06-21 DIAGNOSIS — J309 Allergic rhinitis, unspecified: Secondary | ICD-10-CM | POA: Diagnosis not present

## 2022-06-21 DIAGNOSIS — R42 Dizziness and giddiness: Secondary | ICD-10-CM | POA: Diagnosis not present

## 2022-06-24 ENCOUNTER — Ambulatory Visit: Payer: BC Managed Care – PPO | Admitting: Family Medicine

## 2022-06-29 DIAGNOSIS — L918 Other hypertrophic disorders of the skin: Secondary | ICD-10-CM | POA: Diagnosis not present

## 2022-06-29 DIAGNOSIS — L304 Erythema intertrigo: Secondary | ICD-10-CM | POA: Diagnosis not present

## 2022-06-29 DIAGNOSIS — L821 Other seborrheic keratosis: Secondary | ICD-10-CM | POA: Diagnosis not present

## 2022-06-29 DIAGNOSIS — D225 Melanocytic nevi of trunk: Secondary | ICD-10-CM | POA: Diagnosis not present

## 2022-07-06 ENCOUNTER — Ambulatory Visit: Payer: BC Managed Care – PPO | Admitting: Family Medicine

## 2022-07-19 NOTE — Progress Notes (Unsigned)
[place] Phone: [phone] Subjective:   [name], am serving as a scribe for Dr. Hulan Saas.  I'm seeing this patient by the request  of:  London Pepper, MD  CC: Foot pain and hip pain follow-up from  [id number]  05/07/2022 We reviewed that stretching is critically important to the treatment of PF.   Reviewed footwear. Rigid soles hr iave been shown to help with PF. Night splints can sometimes help. Reviewed rehab of stretching and calf raises.    Could benefit from a corticosteroid injection, orthotics, or other measures if conservative treatment fails. Follow-up again in 6 weeks and if continued pain consider injections  Discussed with patient again about the possibility of injections.  Patient declined them at the moment.  Feels like 3 years ago patient did not get a response but is here now.  We discussed the other possibility is that this could be lumbar radiculopathy.  Discussed different medication choices.  Patient will also start with formal physical therapy.  Patient will see how she does with strengthening of the hip abductors stretching of the hip flexors.  Follow-up with me again in 6 to 8 weeks and then discuss again about imaging and medications      Update 07/21/2022 Nicole [name] is a 56 y.o. female coming in with complaint of R foot and B hip pain. Saw Dr. Georgina Snell for L lateral ankle pain. Patient states that plantar fascia pain on foot is not improving. Has tried arch supports and wearing OOFOS at home. Wears night splint.   L ankle pain is improving. Pain is intermittent and over medial malleolus.   Hip pain has improved as she is sitting at work more than standing.       Past Medical History:  Diagnosis Date   GERD (gastroesophageal reflux disease)    diet controlled - no meds   Headache    Sleep apnea    mild - dental appliance at night   SVD (spontaneous vaginal  delivery)    x 2   Past Surgical History:  Procedure Laterality Date   ABDOMINAL HYSTERECTOMY     COLONOSCOPY     DIAGNOSTIC LAPAROSCOPY     LAPAROSCOPIC APPENDECTOMY N/A 01/19/2019   Procedure: APPENDECTOMY LAPAROSCOPIC;  Surgeon: Erroll Luna, MD;  Location: [place];  Service: General;  Laterality: N/A;   SHOULDER SURGERY Left 2006   Social History   Socioeconomic History   Marital status: Married    Spouse name: Not on file   Number of children: Not on file   Years of education: Not on file   Highest education level: Not on file  Occupational History   Not on file  Tobacco Use   Smoking status: Never   Smokeless tobacco: Never  Vaping Use   Vaping Use: Never used  Substance and Sexual Activity   Alcohol use: Yes   Drug use: Never   Sexual activity: Not on file  Other Topics Concern   Not on file  Social History Narrative   Not on file   Social Determinants of Health   Financial Resource Strain: Not on file  Food Insecurity: Not on file  Transportation Needs: Not on file  Physical Activity: Not on file  Stress: Not on file  Social Connections: Not on file   Allergies  Allergen Reactions   Bee Venom    Family History  Problem Relation Age of Onset   Breast  cancer Mother 31    Current Outpatient Medications (Endocrine & Metabolic):    VIVELLE-DOT 0.075 MG/24HR, Place 1 patch onto the skin 2 (two) times a week.      Current Outpatient Medications (Other):    Vitamin D, Ergocalciferol, (DRISDOL) 1.25 MG (50000 UT) CAPS capsule, TAKE 1 CAPSULE (50,000 UNITS TOTAL) BY MOUTH EVERY 7 (SEVEN) DAYS.   Reviewed prior external information including notes and imaging from  primary care provider As well as notes that were available from care everywhere and other healthcare systems.  Past medical history, social, surgical and family history all reviewed in electronic medical record.  No pertanent information unless stated regarding to the chief complaint.    Review of Systems:  No headache, visual changes, nausea, vomiting, diarrhea, constipation, dizziness, abdominal pain, skin rash, fevers, chills, night sweats, weight loss, swollen lymph nodes, body aches, joint swelling, chest pain, shortness of breath, mood changes. POSITIVE muscle aches  Objective  Blood pressure 120/62, pulse 71, height '5\' 2"'$  (1.575 m), weight 168 lb 9.6 oz (76.5 kg), SpO2 99 %.   General: No apparent distress alert and oriented x3 mood and affect normal, dressed appropriately.  HEENT: Pupils equal, extraocular movements intact  Respiratory: Patient's speak in full sentences and does not appear short of breath  Cardiovascular: No lower extremity edema, non tender, no erythema  Right foot does have tenderness to palpation over the medial calcaneal area.  Tightness of the posterior cord noted.  Limited muscular skeletal ultrasound was performed and interpreted by Hulan Saas, M  Limited ultrasound shows that patient is does have hypoechoic changes of the plantar fascia noted.  No true tearing appreciated though. Impression: Worsening plantar fasciitis    Impression and Recommendations:    The above documentation has been reviewed and is accurate and complete Lyndal Pulley, DO

## 2022-07-21 ENCOUNTER — Ambulatory Visit: Payer: Self-pay

## 2022-07-21 ENCOUNTER — Encounter: Payer: Self-pay | Admitting: Family Medicine

## 2022-07-21 ENCOUNTER — Ambulatory Visit (INDEPENDENT_AMBULATORY_CARE_PROVIDER_SITE_OTHER): Payer: BC Managed Care – PPO | Admitting: Family Medicine

## 2022-07-21 VITALS — BP 120/62 | HR 71 | Ht 62.0 in | Wt 168.6 lb

## 2022-07-21 DIAGNOSIS — M79671 Pain in right foot: Secondary | ICD-10-CM

## 2022-07-21 DIAGNOSIS — M7061 Trochanteric bursitis, right hip: Secondary | ICD-10-CM | POA: Diagnosis not present

## 2022-07-21 DIAGNOSIS — M722 Plantar fascial fibromatosis: Secondary | ICD-10-CM

## 2022-07-21 DIAGNOSIS — M7062 Trochanteric bursitis, left hip: Secondary | ICD-10-CM

## 2022-07-21 NOTE — Assessment & Plan Note (Signed)
Discussed which activities to do and which ones to avoid.  Discussed which activities to do and which ones to avoid.  We discussed with patient different treatment options including steroid injections but patient would like to consider the possibility of PRP.  We discussed the pros and cons of the PRP.  We discussed the post PRP care.  Follow-up again when she is scheduled for PRP.  Total time reviewing patient's notes as well as discussing with patient 31 minutes

## 2022-07-21 NOTE — Patient Instructions (Addendum)
Good to see you Schedule PRP for next week

## 2022-07-21 NOTE — Assessment & Plan Note (Signed)
Also will use the PRP on the greater trochanteric areas bilaterally.  Do think they will be beneficial.  Follow-up at that time

## 2022-07-27 NOTE — Progress Notes (Unsigned)
[place] Phone: [phone] Subjective:   Nicole [name], am serving as a scribe for Dr. Hulan Saas.  I'm seeing this patient by the request  of:  London Pepper, MD  CC: Foot and bilateral hip pain  LDJ:[id number]  07/21/2022 Discussed which activities to do and which ones to avoid.  Discussed which activities to do and which ones to avoid.  We discussed with patient different treatment options including steroid injections but patient would like to consider the possibility of PRP.  We discussed the pros and cons of the PRP.  We discussed the post PRP care.  Follow-up again when she is scheduled for PRP.  Total time reviewing patient's notes as well as discussing with patient 31 minutes   Also will use the PRP on the greater trochanteric areas bilaterally.  Do think they will be beneficial.  Follow-up at that time      Update 07/29/2022 Nicole [name] is a 56 y.o. female coming in with complaint of B hip and R foot pain. Patient states that she her foot is not better and [name] worse. The hips have improved some but are still painful.   Also having pain in L medial ankle.        Past Medical History:  Diagnosis Date   GERD (gastroesophageal reflux disease)    diet controlled - [name] meds   Headache    Sleep apnea    mild - dental appliance at night   SVD (spontaneous vaginal delivery)    x 2   Past Surgical History:  Procedure Laterality Date   ABDOMINAL HYSTERECTOMY     COLONOSCOPY     DIAGNOSTIC LAPAROSCOPY     LAPAROSCOPIC APPENDECTOMY N/A 01/19/2019   Procedure: APPENDECTOMY LAPAROSCOPIC;  Surgeon: Erroll Luna, MD;  Location: [facility];  Service: General;  Laterality: N/A;   SHOULDER SURGERY Left 2006   Social History   Socioeconomic History   Marital status: Married    Spouse name: Not on file   Number of children: Not on file   Years of education: Not on file   Highest education level: Not  on file  Occupational History   Not on file  Tobacco Use   Smoking status: Never   Smokeless tobacco: Never  Vaping Use   Vaping Use: Never used  Substance and Sexual Activity   Alcohol use: Yes   Drug use: Never   Sexual activity: Not on file  Other Topics Concern   Not on file  Social History Narrative   Not on file   Social Determinants of Health   Financial Resource Strain: Not on file  Food Insecurity: Not on file  Transportation Needs: Not on file  Physical Activity: Not on file  Stress: Not on file  Social Connections: Not on file   Allergies  Allergen Reactions   Bee Venom    Family History  Problem Relation Age of Onset   Breast cancer Mother 48    Current Outpatient Medications (Endocrine & Metabolic):    VIVELLE-DOT 0.075 MG/24HR, Place 1 patch onto the skin 2 (two) times a week.      Current Outpatient Medications (Other):    Vitamin D, Ergocalciferol, (DRISDOL) 1.25 MG (50000 UT) CAPS capsule, TAKE 1 CAPSULE (50,000 UNITS TOTAL) BY MOUTH EVERY 7 (SEVEN) DAYS.    Objective  Blood pressure 102/68, pulse (!) 58, height '5\' 2"'$  (1.575 m), weight 167 lb (75.8 kg), SpO2 99 %.  General: [name] apparent distress alert and oriented x3 mood and affect normal, dressed appropriately.  HEENT: Pupils equal, extraocular movements intact  Respiratory: Patient's speak in full sentences and does not appear short of breath  Cardiovascular: [name] lower extremity edema, non tender, [name] erythema    Procedure: Real-time Ultrasound Guided Injection of right greater trochanteric bursitis secondary to patient's body habitus Device: GE Logiq Q7 Ultrasound guided injection is preferred based studies that show increased duration, increased effect, greater accuracy, decreased procedural pain, increased response rate, and decreased cost with ultrasound guided versus blind injection.  Verbal informed consent obtained.  Time-out conducted.  Noted [name] overlying erythema, induration, or  other signs of local infection.  Skin prepped in a sterile fashion.  Local anesthesia: Topical Ethyl chloride.  With sterile technique and under real time ultrasound guidance:  Greater trochanteric area was visualized and patient's bursa was noted. A 22-gauge 3 inch needle was inserted and 1 cc of 0.5% Marcaine and then injected with 2 cc of PRP. Pain immediately resolved suggesting accurate placement of the medication.  Advised to call if fevers/chills, erythema, induration, drainage, or persistent bleeding.  Impression: Technically successful ultrasound guided injection.   Procedure: Real-time Ultrasound Guided Injection of left  greater trochanteric bursitis secondary to patient's body habitus Device: GE Logiq Q7  Ultrasound guided injection is preferred based studies that show increased duration, increased effect, greater accuracy, decreased procedural pain, increased response rate, and decreased cost with ultrasound guided versus blind injection.  Verbal informed consent obtained.  Time-out conducted.  Noted [name] overlying erythema, induration, or other signs of local infection.  Skin prepped in a sterile fashion.  Local anesthesia: Topical Ethyl chloride.  With sterile technique and under real time ultrasound guidance:  Greater trochanteric area was visualized and patient's bursa was noted. A 22-gauge 3 inch needle was inserted and 1 cc of 0.5% Marcaine and 2 cc of PRP Pain immediately resolved suggesting accurate placement of the medication.  Advised to call if fevers/chills, erythema, induration, drainage, or persistent bleeding.  Impression: Technically successful ultrasound guided injection.  Procedure: Real-time Ultrasound Guided Injection of left plantar fascia Device: GE Logiq Q7 Ultrasound guided injection is preferred based studies that show increased duration, increased effect, greater accuracy, decreased procedural pain, increased response rate, and decreased cost with  ultrasound guided versus blind injection.  Verbal informed consent obtained.  Time-out conducted.  Noted [name] overlying erythema, induration, or other signs of local infection.  Skin prepped in a sterile fashion.  Local anesthesia: Topical Ethyl chloride.  With sterile technique and under real time ultrasound guidance: With a 21-gauge 2 inch needle injected with 0.5 cc of 0.5% Marcaine and injected with 2 cc of PRP Completed without difficulty  Pain immediately resolved suggesting accurate placement of the medication.  Advised to call if fevers/chills, erythema, induration, drainage, or persistent bleeding.  Impression: Technically successful ultrasound guided injection.   Impression and Recommendations:    The above documentation has been reviewed and is accurate and complete Lyndal Pulley, DO

## 2022-07-29 ENCOUNTER — Ambulatory Visit: Payer: Self-pay

## 2022-07-29 ENCOUNTER — Ambulatory Visit (INDEPENDENT_AMBULATORY_CARE_PROVIDER_SITE_OTHER): Payer: Self-pay | Admitting: Family Medicine

## 2022-07-29 ENCOUNTER — Encounter: Payer: Self-pay | Admitting: Family Medicine

## 2022-07-29 VITALS — BP 102/68 | HR 58 | Ht 62.0 in | Wt 167.0 lb

## 2022-07-29 DIAGNOSIS — M25551 Pain in right hip: Secondary | ICD-10-CM

## 2022-07-29 DIAGNOSIS — M7061 Trochanteric bursitis, right hip: Secondary | ICD-10-CM

## 2022-07-29 DIAGNOSIS — M25552 Pain in left hip: Secondary | ICD-10-CM

## 2022-07-29 DIAGNOSIS — M722 Plantar fascial fibromatosis: Secondary | ICD-10-CM

## 2022-07-29 DIAGNOSIS — M7062 Trochanteric bursitis, left hip: Secondary | ICD-10-CM

## 2022-07-29 NOTE — Assessment & Plan Note (Signed)
Post PRP protocol given.  Discussed potential side effects.  Patient given the handout.  Follow-up again in 6 weeks

## 2022-07-29 NOTE — Patient Instructions (Signed)
No ice or IBU for 3 days Heat and Tylenol are ok See me again in 6 weeks 

## 2022-08-10 ENCOUNTER — Other Ambulatory Visit: Payer: Self-pay | Admitting: Obstetrics and Gynecology

## 2022-08-10 DIAGNOSIS — Z1231 Encounter for screening mammogram for malignant neoplasm of breast: Secondary | ICD-10-CM

## 2022-09-02 NOTE — Progress Notes (Signed)
[place] Phone: [phone] Subjective:    I'm seeing this patient by the request  of:  London Pepper, MD  CC: Right foot pain follow-up  [id number]  07/29/2022 Post PRP protocol given.  Discussed potential side effects.  Patient given the handout.  Follow-up again in 6 weeks     Update 09/10/2022 Cole Klugh is a 56 y.o. female coming in with complaint of L hip and L foot pain. PRP last visit. Patient states that  R hip is better R foot still working through it hasn't been compliant with HEP       Past Medical History:  Diagnosis Date   GERD (gastroesophageal reflux disease)    diet controlled - no meds   Headache    Sleep apnea    mild - dental appliance at night   SVD (spontaneous vaginal delivery)    x 2   Past Surgical History:  Procedure Laterality Date   ABDOMINAL HYSTERECTOMY     COLONOSCOPY     DIAGNOSTIC LAPAROSCOPY     LAPAROSCOPIC APPENDECTOMY N/A 01/19/2019   Procedure: APPENDECTOMY LAPAROSCOPIC;  Surgeon: Erroll Luna, MD;  Location: [facility];  Service: General;  Laterality: N/A;   SHOULDER SURGERY Left 2006   Social History   Socioeconomic History   Marital status: Married    Spouse name: Not on file   Number of children: Not on file   Years of education: Not on file   Highest education level: Not on file  Occupational History   Not on file  Tobacco Use   Smoking status: Never   Smokeless tobacco: Never  Vaping Use   Vaping Use: Never used  Substance and Sexual Activity   Alcohol use: Yes   Drug use: Never   Sexual activity: Not on file  Other Topics Concern   Not on file  Social History Narrative   Not on file   Social Determinants of Health   Financial Resource Strain: Not on file  Food Insecurity: Not on file  Transportation Needs: Not on file  Physical Activity: Not on file  Stress: Not on file  Social Connections: Not on file   Allergies   Allergen Reactions   Bee Venom    Family History  Problem Relation Age of Onset   Breast cancer Mother 17    Current Outpatient Medications (Endocrine & Metabolic):    VIVELLE-DOT 0.075 MG/24HR, Place 1 patch onto the skin 2 (two) times a week.      Current Outpatient Medications (Other):    Vitamin D, Ergocalciferol, (DRISDOL) 1.25 MG (50000 UT) CAPS capsule, TAKE 1 CAPSULE (50,000 UNITS TOTAL) BY MOUTH EVERY 7 (SEVEN) DAYS.   Reviewed prior external information including notes and imaging from  primary care provider As well as notes that were available from care everywhere and other healthcare systems.  Past medical history, social, surgical and family history all reviewed in electronic medical record.  No pertanent information unless stated regarding to the chief complaint.   Review of Systems:  No headache, visual changes, nausea, vomiting, diarrhea, constipation, dizziness, abdominal pain, skin rash, fevers, chills, night sweats, weight loss, swollen lymph nodes, body aches, joint swelling, chest pain, shortness of breath, mood changes. POSITIVE muscle aches  Objective  Blood pressure 120/80, pulse 71, height '5\' 2"'$  (1.575 m), weight 165 lb (74.8 kg), SpO2 99 %.   General: No apparent distress alert and oriented x3 mood and affect normal, dressed  appropriately.  HEENT: Pupils equal, extraocular movements intact  Respiratory: Patient's speak in full sentences and does not appear short of breath  Cardiovascular: No lower extremity edema, non tender, no erythema  Patient's right foot does have some tenderness to palpation over the medial calcaneal area.  Patient does have some tightness noted of the foot as well.  Mild breakdown of the longitudinal arch noted.  Patient does have some breakdown of the transverse arch with bunion and bunionette formation also noted.  Limited muscular skeletal ultrasound was performed and interpreted by Hulan Saas, M   Limited ultrasound of  patient's right heel shows that there is some callus or scar tissue formation at the insertion of the plantar fascia noted.  Still has some hypoechoic changes noted at the origin.  No increase in Doppler flow noted. Impression: Interval improvement    Impression and Recommendations:     The above documentation has been reviewed and is accurate and complete Lyndal Pulley, DO

## 2022-09-09 ENCOUNTER — Telehealth: Payer: Self-pay | Admitting: *Deleted

## 2022-09-09 NOTE — Patient Outreach (Signed)
  Care Coordination   09/09/2022 Name: Nicole [name] MRN: [id number] DOB: [birth year]   Care Coordination Outreach Attempts:  An unsuccessful telephone outreach was attempted today to offer the patient information about available care coordination services as a benefit of their health plan.   Follow Up Plan:  Additional outreach attempts will be made to offer the patient care coordination information and services.   Encounter Outcome:  No Answer   Care Coordination Interventions:  No, not indicated    Raina Mina, RN Care Management Coordinator [place] Office [phone]

## 2022-09-10 ENCOUNTER — Ambulatory Visit: Payer: Self-pay

## 2022-09-10 ENCOUNTER — Ambulatory Visit (INDEPENDENT_AMBULATORY_CARE_PROVIDER_SITE_OTHER): Payer: BC Managed Care – PPO | Admitting: Family Medicine

## 2022-09-10 VITALS — BP 120/80 | HR 71 | Ht 62.0 in | Wt 165.0 lb

## 2022-09-10 DIAGNOSIS — M25552 Pain in left hip: Secondary | ICD-10-CM | POA: Diagnosis not present

## 2022-09-10 DIAGNOSIS — M722 Plantar fascial fibromatosis: Secondary | ICD-10-CM

## 2022-09-10 DIAGNOSIS — M25551 Pain in right hip: Secondary | ICD-10-CM | POA: Diagnosis not present

## 2022-09-10 NOTE — Patient Instructions (Addendum)
6 weeks just in case

## 2022-09-10 NOTE — Assessment & Plan Note (Signed)
Ultrasound does show that the PRP is helping with some of the partial tearing that was noted previously but continues to have some inflammation that is consistent with the fasciitis.  Discussed with patient about icing regimen and home exercises, discussed with patient about proper shoes.  Will need to consider the possibility of custom orthotics in the long run.  Follow-up with me again in 6 to 8 weeks otherwise.

## 2022-09-15 DIAGNOSIS — R059 Cough, unspecified: Secondary | ICD-10-CM | POA: Diagnosis not present

## 2022-09-15 DIAGNOSIS — J988 Other specified respiratory disorders: Secondary | ICD-10-CM | POA: Diagnosis not present

## 2022-10-14 DIAGNOSIS — Z13820 Encounter for screening for osteoporosis: Secondary | ICD-10-CM | POA: Diagnosis not present

## 2022-10-14 DIAGNOSIS — Z6831 Body mass index (BMI) 31.0-31.9, adult: Secondary | ICD-10-CM | POA: Diagnosis not present

## 2022-10-14 DIAGNOSIS — Z01419 Encounter for gynecological examination (general) (routine) without abnormal findings: Secondary | ICD-10-CM | POA: Diagnosis not present

## 2022-10-20 NOTE — Progress Notes (Signed)
// Charlann Boxer D.O. [place] [address] [place] Phone: [phone] Subjective:   Nicole [name], am serving as a scribe for Dr. Hulan Saas.  I'm seeing this patient by the request  of:  London Pepper, MD  CC: Right foot and left hip pain  [id number]  09/10/2022 Ultrasound does show that the PRP is helping with some of the partial tearing that was noted previously but continues to have some inflammation that is consistent with the fasciitis.  Discussed with patient about icing regimen and home exercises, discussed with patient about proper shoes.  Will need to consider the possibility of custom orthotics in the long run.  Follow-up with me again in 6 to 8 weeks otherwise.      Update 10/22/2022 Nicole [name] is a 57 y.o. female coming in with complaint of R plantar fasciitis. Patient states intensity of pain has decreased, but still aware of the problem. Also wants to follow up on L hip pain. New area of pain since last GT injection       Past Medical History:  Diagnosis Date   GERD (gastroesophageal reflux disease)    diet controlled - no meds   Headache    Sleep apnea    mild - dental appliance at night   SVD (spontaneous vaginal delivery)    x 2   Ultrasound does show that the PRP is helping with some of the partial tearing that was noted previously but continues to have some inflammation that is consistent with the fasciitis.  Discussed with patient about icing regimen and home exercises, discussed with patient about proper shoes.  Will need to consider the possibility of custom orthotics in the long run.  Follow-up with me again in 6 to 8 weeks otherwise.     Update 10/22/2022 Past Surgical History:  Procedure Laterality Date   ABDOMINAL HYSTERECTOMY     COLONOSCOPY     DIAGNOSTIC LAPAROSCOPY     LAPAROSCOPIC APPENDECTOMY N/A 01/19/2019   Procedure: APPENDECTOMY LAPAROSCOPIC;  Surgeon: Erroll Luna, MD;  Location: [facility];   Service: General;  Laterality: N/A;   SHOULDER SURGERY Left 2006   Social History   Socioeconomic History   Marital status: Married    Spouse name: Not on file   Number of children: Not on file   Years of education: Not on file   Highest education level: Not on file  Occupational History   Not on file  Tobacco Use   Smoking status: Never   Smokeless tobacco: Never  Vaping Use   Vaping Use: Never used  Substance and Sexual Activity   Alcohol use: Yes   Drug use: Never   Sexual activity: Not on file  Other Topics Concern   Not on file  Social History Narrative   Not on file   Social Determinants of Health   Financial Resource Strain: Not on file  Food Insecurity: Not on file  Transportation Needs: Not on file  Physical Activity: Not on file  Stress: Not on file  Social Connections: Not on file   Allergies  Allergen Reactions   Bee Venom    Family History  Problem Relation Age of Onset   Breast cancer Mother 19    Current Outpatient Medications (Endocrine & Metabolic):    VIVELLE-DOT 0.075 MG/24HR, Place 1 patch onto the skin 2 (two) times a week.      Current Outpatient Medications (Other):    Vitamin D, Ergocalciferol, (DRISDOL) 1.25 MG (  50000 UT) CAPS capsule, TAKE 1 CAPSULE (50,000 UNITS TOTAL) BY MOUTH EVERY 7 (SEVEN) DAYS.   Reviewed prior external information including notes and imaging from  primary care provider As well as notes that were available from care everywhere and other healthcare systems.  Past medical history, social, surgical and family history all reviewed in electronic medical record.  No pertanent information unless stated regarding to the chief complaint.   Review of Systems:  No headache, visual changes, nausea, vomiting, diarrhea, constipation, dizziness, abdominal pain, skin rash, fevers, chills, night sweats, weight loss, swollen lymph nodes, body aches, joint swelling, chest pain, shortness of breath, mood changes. POSITIVE  muscle aches  Objective  Blood pressure 102/66, pulse 70, height 5' 2"$  (1.575 m), weight 166 lb (75.3 kg), SpO2 98 %.   General: No apparent distress alert and oriented x3 mood and affect normal, dressed appropriately.  HEENT: Pupils equal, extraocular movements intact  Respiratory: Patient's speak in full sentences and does not appear short of breath  Cardiovascular: No lower extremity edema, non tender, no erythema  Left hip exam does have some impingement noted.  Patient does have some mild weakness of 4-5 strength compared to the contralateral side.  No significant swelling noted.  Minimal tenderness over the greater trochanteric area which is significantly improved.  Right foot exam has some very mild tenderness noted over the calcaneal area.  Significant improvement from previous exam.  Good ankle range of motion   Limited muscular skeletal ultrasound was performed and interpreted by Hulan Saas, M  Limited ultrasound of patient's right plantar fascia shows some very mild hypoechoic changes but significant improvement in the plantar fascia noted.  Seems to be more in the sheath than anything else at this time. Regarding the hip patient does have some narrowing noted.  No hypoechoic changes, no abnormality of the anterior labrum that was noted at the moment. Impression: Very mild plantar fasciitis     Impression and Recommendations:     The above documentation has been reviewed and is accurate and complete Lyndal Pulley, DO

## 2022-10-22 ENCOUNTER — Ambulatory Visit (INDEPENDENT_AMBULATORY_CARE_PROVIDER_SITE_OTHER): Payer: BC Managed Care – PPO

## 2022-10-22 ENCOUNTER — Ambulatory Visit: Payer: Self-pay

## 2022-10-22 ENCOUNTER — Ambulatory Visit (INDEPENDENT_AMBULATORY_CARE_PROVIDER_SITE_OTHER): Payer: BC Managed Care – PPO | Admitting: Family Medicine

## 2022-10-22 ENCOUNTER — Encounter: Payer: Self-pay | Admitting: Family Medicine

## 2022-10-22 VITALS — BP 102/66 | HR 70 | Ht 62.0 in | Wt 166.0 lb

## 2022-10-22 DIAGNOSIS — M25552 Pain in left hip: Secondary | ICD-10-CM | POA: Diagnosis not present

## 2022-10-22 DIAGNOSIS — M7062 Trochanteric bursitis, left hip: Secondary | ICD-10-CM | POA: Diagnosis not present

## 2022-10-22 DIAGNOSIS — M722 Plantar fascial fibromatosis: Secondary | ICD-10-CM

## 2022-10-22 DIAGNOSIS — M7061 Trochanteric bursitis, right hip: Secondary | ICD-10-CM

## 2022-10-22 NOTE — Patient Instructions (Addendum)
Xray today Do prescribed exercises at least 3x a week Foot looks incredible Okay to increase activity No more lunges  See you again in 2 months

## 2022-10-22 NOTE — Assessment & Plan Note (Signed)
Seems to be more left hip impingement.  Discussed with patient about recent management of allergic resources.  Discussed with patient that I do feel bloated lumbar x-ray could be beneficial.  Ultrasound today does show that there is narrowing noted.  Would like to avoid anything such as surgical intervention of course.  Discussed different articular techniques that I think will be beneficial.  Follow-up with me again in 6 to 8 weeks

## 2022-10-22 NOTE — Assessment & Plan Note (Signed)
Significant improvement noted.  Some very mild hypoechoic changes still noted on the center.  Discussed icing regimen and home exercises.  Increase activity slowly.  Follow-up 6 to 8 weeks

## 2022-10-29 ENCOUNTER — Ambulatory Visit
Admission: RE | Admit: 2022-10-29 | Discharge: 2022-10-29 | Disposition: A | Discharge status: Home / Self Care | Payer: BC Managed Care – PPO | Source: Ambulatory Visit | Attending: Obstetrics and Gynecology | Admitting: Obstetrics and Gynecology

## 2022-10-29 DIAGNOSIS — Z1231 Encounter for screening mammogram for malignant neoplasm of breast: Secondary | ICD-10-CM | POA: Diagnosis not present

## 2022-12-17 NOTE — Progress Notes (Unsigned)
[name] [address] [place] [address] Phone: [phone] Subjective:   INadine Counts, am serving as a scribe for Dr. Antoine Primas.  I'm seeing this patient by the request  of:  Farris Has, MD  CC: Foot pain and hip pain follow-up  [id number]  10/22/2022 Significant improvement noted.  Some very mild hypoechoic changes still noted on the center.  Discussed icing regimen and home exercises.  Increase activity slowly.  Follow-up 6 to 8 weeks     Seems to be more left hip impingement.  Discussed with patient about recent management of allergic resources.  Discussed with patient that I do feel bloated lumbar x-ray could be beneficial.  Ultrasound today does show that there is narrowing noted.  Would like to avoid anything such as surgical intervention of course.  Discussed different articular techniques that I think will be beneficial.  Follow-up with me again in 6 to 8 weeks      Update 12/20/2022 Autrey Mushtaq is a 57 y.o. female coming in with complaint of L hip and R foot pain. Patient states foot doing better. Has issue with discomfort in toes between the 3rd and 4th toe on R foot. Has been going on for about 6 weeks, but has gotten better. Still a little numb and swollen. Hip pain has shifted to the lateral side. Is in the gym 3 times a week.      Past Medical History:  Diagnosis Date   GERD (gastroesophageal reflux disease)    diet controlled - no meds   Headache    Sleep apnea    mild - dental appliance at night   SVD (spontaneous vaginal delivery)    x 2   Past Surgical History:  Procedure Laterality Date   ABDOMINAL HYSTERECTOMY     COLONOSCOPY     DIAGNOSTIC LAPAROSCOPY     LAPAROSCOPIC APPENDECTOMY N/A 01/19/2019   Procedure: APPENDECTOMY LAPAROSCOPIC;  Surgeon: Harriette Bouillon, MD;  Location: [facility];  Service: General;  Laterality: N/A;   SHOULDER SURGERY Left 2006   Social History   Socioeconomic History    Marital status: Married    Spouse name: Not on file   Number of children: Not on file   Years of education: Not on file   Highest education level: Not on file  Occupational History   Not on file  Tobacco Use   Smoking status: Never   Smokeless tobacco: Never  Vaping Use   Vaping Use: Never used  Substance and Sexual Activity   Alcohol use: Yes   Drug use: Never   Sexual activity: Not on file  Other Topics Concern   Not on file  Social History Narrative   Not on file   Social Determinants of Health   Financial Resource Strain: Not on file  Food Insecurity: Not on file  Transportation Needs: Not on file  Physical Activity: Not on file  Stress: Not on file  Social Connections: Not on file   Allergies  Allergen Reactions   Bee Venom    Family History  Problem Relation Age of Onset   Breast cancer Mother 72    Current Outpatient Medications (Endocrine & Metabolic):    VIVELLE-DOT 0.075 MG/24HR, Place 1 patch onto the skin 2 (two) times a week.      Current Outpatient Medications (Other):    Vitamin D, Ergocalciferol, (DRISDOL) 1.25 MG (50000 UT) CAPS capsule, TAKE 1 CAPSULE (50,000 UNITS TOTAL) BY MOUTH EVERY 7 (  SEVEN) DAYS.   Reviewed prior external information including notes and imaging from  primary care provider As well as notes that were available from care everywhere and other healthcare systems.  Past medical history, social, surgical and family history all reviewed in electronic medical record.  No pertanent information unless stated regarding to the chief complaint.   Review of Systems:  No headache, visual changes, nausea, vomiting, diarrhea, constipation, dizziness, abdominal pain, skin rash, fevers, chills, night sweats, weight loss, swollen lymph nodes, body aches, joint swelling, chest pain, shortness of breath, mood changes. POSITIVE muscle aches  Objective  Blood pressure 118/86, pulse 61, height 5\' 2"  (1.575 m), weight 167 lb (75.8 kg), SpO2 98  %.   General: No apparent distress alert and oriented x3 mood and affect normal, dressed appropriately.  HEENT: Pupils equal, extraocular movements intact  Respiratory: Patient's speak in full sentences and does not appear short of breath  Cardiovascular: No lower extremity edema, non tender, no erythema  Left hip has tenderness to palpation over the greater trochanteric area.  Patient has good range of motion with internal range of motion, follow positive Pearlean BrownieFaber though on the left hip.  Right foot exam still has a peripheral longitudinal arch, breakdown of the transverse arch noted.  Still swell.  Positive squeeze test noted.  Limited muscular skeletal ultrasound was performed and interpreted by Antoine PrimasSMITH, Charisma Charlot, M  Limited ultrasound shows the patient has significant decrease in the hypoechoic changes in size of the plantar fascia, seems to be completely resolved at this time.  Patient does have what appears to be an early neuroma noted between the third and fourth metatarsal heads.  No increase in Doppler flow in the area.  Just hypoechoic changes. Impression: Resolved plantar fasciitis with a early neuroma    Impression and Recommendations:     The above documentation has been reviewed and is accurate and complete Judi SaaZachary M Saed Hudlow, DO

## 2022-12-21 ENCOUNTER — Other Ambulatory Visit: Payer: Self-pay

## 2022-12-21 ENCOUNTER — Encounter: Payer: Self-pay | Admitting: Family Medicine

## 2022-12-21 ENCOUNTER — Ambulatory Visit (INDEPENDENT_AMBULATORY_CARE_PROVIDER_SITE_OTHER): Payer: BC Managed Care – PPO | Admitting: Family Medicine

## 2022-12-21 VITALS — BP 118/86 | HR 61 | Ht 62.0 in | Wt 167.0 lb

## 2022-12-21 DIAGNOSIS — G5781 Other specified mononeuropathies of right lower limb: Secondary | ICD-10-CM | POA: Diagnosis not present

## 2022-12-21 DIAGNOSIS — M7061 Trochanteric bursitis, right hip: Secondary | ICD-10-CM

## 2022-12-21 DIAGNOSIS — M79671 Pain in right foot: Secondary | ICD-10-CM

## 2022-12-21 DIAGNOSIS — M722 Plantar fascial fibromatosis: Secondary | ICD-10-CM | POA: Diagnosis not present

## 2022-12-21 DIAGNOSIS — M7062 Trochanteric bursitis, left hip: Secondary | ICD-10-CM

## 2022-12-21 NOTE — Assessment & Plan Note (Signed)
Between the third and fourth, discussed the importance of a metatarsal pad and over-the-counter orthotics.  Discussed some exercises, discussed avoiding walking barefoot.  Worsening pain consider the possibility of injection.  Follow-up again in 6 to 8 weeks new problem

## 2022-12-21 NOTE — Assessment & Plan Note (Signed)
Significant improvement should be completely Within the next month or so.  Patient was encouraged to start increasing activity.  Follow-up with me on an as-needed basis for this problem.

## 2022-12-21 NOTE — Assessment & Plan Note (Signed)
Worsening bursitis of the left hip again, likely compensating for some of the foot, increase activity.  Patient with some hold on any type of injection at the moment.  Worsening symptoms we will consider it again.  Follow-up again in 6 to 8 weeks otherwise

## 2022-12-21 NOTE — Patient Instructions (Addendum)
[place] total support orthotics  Get the slims for the work shoes, original for the others Keep wrist in neutral position when working out Use hip exercises for after working out See you again in 3 months if needed

## 2023-03-15 NOTE — Progress Notes (Deleted)
[name] [address] [place] [address] Phone: [phone] Subjective:    I'm seeing this patient by the request  of:  Farris Has, MD  CC:   [id number]  12/21/2022 Between the third and fourth, discussed the importance of a metatarsal pad and over-the-counter orthotics. Discussed some exercises, discussed avoiding walking barefoot. Worsening pain consider the possibility of injection. Follow-up again in 6 to 8 weeks new problem   Worsening bursitis of the left hip again, likely compensating for some of the foot, increase activity.  Patient with some hold on any type of injection at the moment.  Worsening symptoms we will consider it again.  Follow-up again in 6 to 8 weeks otherwise      Update 03/24/2023 Nicole [name] is a 57 y.o. female coming in with complaint of R foot and B hip pain. Patient states       Past Medical History:  Diagnosis Date   GERD (gastroesophageal reflux disease)    diet controlled - no meds   Headache    Sleep apnea    mild - dental appliance at night   SVD (spontaneous vaginal delivery)    x 2   Past Surgical History:  Procedure Laterality Date   ABDOMINAL HYSTERECTOMY     COLONOSCOPY     DIAGNOSTIC LAPAROSCOPY     LAPAROSCOPIC APPENDECTOMY N/A 01/19/2019   Procedure: APPENDECTOMY LAPAROSCOPIC;  Surgeon: Harriette Bouillon, MD;  Location: [facility];  Service: General;  Laterality: N/A;   SHOULDER SURGERY Left 2006   Social History   Socioeconomic History   Marital status: Married    Spouse name: Not on file   Number of children: Not on file   Years of education: Not on file   Highest education level: Not on file  Occupational History   Not on file  Tobacco Use   Smoking status: Never   Smokeless tobacco: Never  Vaping Use   Vaping Use: Never used  Substance and Sexual Activity   Alcohol use: Yes   Drug use: Never   Sexual activity: Not on file  Other Topics Concern   Not on file  Social  History Narrative   Not on file   Social Determinants of Health   Financial Resource Strain: Not on file  Food Insecurity: Not on file  Transportation Needs: Not on file  Physical Activity: Not on file  Stress: Not on file  Social Connections: Not on file   Allergies  Allergen Reactions   Bee Venom    Family History  Problem Relation Age of Onset   Breast cancer Mother 25    Current Outpatient Medications (Endocrine & Metabolic):    VIVELLE-DOT 0.075 MG/24HR, Place 1 patch onto the skin 2 (two) times a week.      Current Outpatient Medications (Other):    Vitamin D, Ergocalciferol, (DRISDOL) 1.25 MG (50000 UT) CAPS capsule, TAKE 1 CAPSULE (50,000 UNITS TOTAL) BY MOUTH EVERY 7 (SEVEN) DAYS.   Reviewed prior external information including notes and imaging from  primary care provider As well as notes that were available from care everywhere and other healthcare systems.  Past medical history, social, surgical and family history all reviewed in electronic medical record.  No pertanent information unless stated regarding to the chief complaint.   Review of Systems:  No headache, visual changes, nausea, vomiting, diarrhea, constipation, dizziness, abdominal pain, skin rash, fevers, chills, night sweats, weight loss, swollen lymph nodes, body aches, joint swelling, chest pain,  shortness of breath, mood changes. POSITIVE muscle aches  Objective  There were no vitals taken for this visit.   General: No apparent distress alert and oriented x3 mood and affect normal, dressed appropriately.  HEENT: Pupils equal, extraocular movements intact  Respiratory: Patient's speak in full sentences and does not appear short of breath  Cardiovascular: No lower extremity edema, non tender, no erythema      Impression and Recommendations:

## 2023-03-24 ENCOUNTER — Ambulatory Visit: Payer: BC Managed Care – PPO | Admitting: Family Medicine

## 2023-04-01 DIAGNOSIS — Z713 Dietary counseling and surveillance: Secondary | ICD-10-CM | POA: Diagnosis not present

## 2023-04-07 DIAGNOSIS — Z713 Dietary counseling and surveillance: Secondary | ICD-10-CM | POA: Diagnosis not present

## 2023-04-12 DIAGNOSIS — Z0000 Encounter for general adult medical examination without abnormal findings: Secondary | ICD-10-CM | POA: Diagnosis not present

## 2023-04-12 DIAGNOSIS — R5383 Other fatigue: Secondary | ICD-10-CM | POA: Diagnosis not present

## 2023-04-12 DIAGNOSIS — Z13220 Encounter for screening for lipoid disorders: Secondary | ICD-10-CM | POA: Diagnosis not present

## 2023-04-12 DIAGNOSIS — E559 Vitamin D deficiency, unspecified: Secondary | ICD-10-CM | POA: Diagnosis not present

## 2023-04-12 DIAGNOSIS — R7301 Impaired fasting glucose: Secondary | ICD-10-CM | POA: Diagnosis not present

## 2023-04-14 NOTE — Progress Notes (Deleted)
[name] [address] [place] [address] Phone: [phone] Subjective:    I'm seeing this patient by the request  of:  Farris Has, MD  CC:   [id number]  12/21/2022 Between the third and fourth, discussed the importance of a metatarsal pad and over-the-counter orthotics.  Discussed some exercises, discussed avoiding walking barefoot.  Worsening pain consider the possibility of injection.  Follow-up again in 6 to 8 weeks new problem   Worsening bursitis of the left hip again, likely compensating for some of the foot, increase activity.  Patient with some hold on any type of injection at the moment.  Worsening symptoms we will consider it again.  Follow-up again in 6 to 8 weeks otherwise     Significant improvement should be completely Within the next month or so.  Patient was encouraged to start increasing activity.  Follow-up with me on an as-needed basis for this problem.     Update 04/18/2023 Nicole [name] is a 57 y.o. female coming in with complaint of R foot and B hip pain. Patient states        Past Medical History:  Diagnosis Date   GERD (gastroesophageal reflux disease)    diet controlled - no meds   Headache    Sleep apnea    mild - dental appliance at night   SVD (spontaneous vaginal delivery)    x 2   Past Surgical History:  Procedure Laterality Date   ABDOMINAL HYSTERECTOMY     COLONOSCOPY     DIAGNOSTIC LAPAROSCOPY     LAPAROSCOPIC APPENDECTOMY N/A 01/19/2019   Procedure: APPENDECTOMY LAPAROSCOPIC;  Surgeon: Harriette Bouillon, MD;  Location: [facility] OR;  Service: General;  Laterality: N/A;   SHOULDER SURGERY Left 2006   Social History   Socioeconomic History   Marital status: Married    Spouse name: Not on file   Number of children: Not on file   Years of education: Not on file   Highest education level: Not on file  Occupational History   Not on file  Tobacco Use   Smoking status: Never   Smokeless tobacco:  Never  Vaping Use   Vaping status: Never Used  Substance and Sexual Activity   Alcohol use: Yes   Drug use: Never   Sexual activity: Not on file  Other Topics Concern   Not on file  Social History Narrative   Not on file   Social Determinants of Health   Financial Resource Strain: Not on file  Food Insecurity: Not on file  Transportation Needs: Not on file  Physical Activity: Not on file  Stress: Not on file  Social Connections: Not on file   Allergies  Allergen Reactions   Bee Venom    Family History  Problem Relation Age of Onset   Breast cancer Mother 109    Current Outpatient Medications (Endocrine & Metabolic):    VIVELLE-DOT 0.075 MG/24HR, Place 1 patch onto the skin 2 (two) times a week.      Current Outpatient Medications (Other):    Vitamin D, Ergocalciferol, (DRISDOL) 1.25 MG (50000 UT) CAPS capsule, TAKE 1 CAPSULE (50,000 UNITS TOTAL) BY MOUTH EVERY 7 (SEVEN) DAYS.   Reviewed prior external information including notes and imaging from  primary care provider As well as notes that were available from care everywhere and other healthcare systems.  Past medical history, social, surgical and family history all reviewed in electronic medical record.  No pertanent information unless stated regarding to  the chief complaint.   Review of Systems:  No headache, visual changes, nausea, vomiting, diarrhea, constipation, dizziness, abdominal pain, skin rash, fevers, chills, night sweats, weight loss, swollen lymph nodes, body aches, joint swelling, chest pain, shortness of breath, mood changes. POSITIVE muscle aches  Objective  There were no vitals taken for this visit.   General: No apparent distress alert and oriented x3 mood and affect normal, dressed appropriately.  HEENT: Pupils equal, extraocular movements intact  Respiratory: Patient's speak in full sentences and does not appear short of breath  Cardiovascular: No lower extremity edema, non tender, no  erythema      Impression and Recommendations:

## 2023-04-15 DIAGNOSIS — Z713 Dietary counseling and surveillance: Secondary | ICD-10-CM | POA: Diagnosis not present

## 2023-04-19 ENCOUNTER — Ambulatory Visit: Payer: BC Managed Care – PPO | Admitting: Family Medicine

## 2023-04-22 ENCOUNTER — Other Ambulatory Visit (HOSPITAL_COMMUNITY): Payer: Self-pay | Admitting: Family Medicine

## 2023-04-22 DIAGNOSIS — E785 Hyperlipidemia, unspecified: Secondary | ICD-10-CM

## 2023-04-27 DIAGNOSIS — Z713 Dietary counseling and surveillance: Secondary | ICD-10-CM | POA: Diagnosis not present

## 2023-04-28 ENCOUNTER — Ambulatory Visit: Payer: BC Managed Care – PPO | Admitting: Family Medicine

## 2023-05-18 DIAGNOSIS — F419 Anxiety disorder, unspecified: Secondary | ICD-10-CM | POA: Diagnosis not present

## 2023-05-19 ENCOUNTER — Other Ambulatory Visit: Payer: Self-pay | Admitting: Internal Medicine

## 2023-05-19 DIAGNOSIS — R635 Abnormal weight gain: Secondary | ICD-10-CM | POA: Diagnosis not present

## 2023-05-19 DIAGNOSIS — D3502 Benign neoplasm of left adrenal gland: Secondary | ICD-10-CM | POA: Diagnosis not present

## 2023-05-23 DIAGNOSIS — D3502 Benign neoplasm of left adrenal gland: Secondary | ICD-10-CM | POA: Diagnosis not present

## 2023-05-24 ENCOUNTER — Encounter: Payer: Self-pay | Admitting: Internal Medicine

## 2023-05-25 DIAGNOSIS — Z713 Dietary counseling and surveillance: Secondary | ICD-10-CM | POA: Diagnosis not present

## 2023-05-25 NOTE — Progress Notes (Unsigned)
[name] [address] [place] [address] Phone: [phone] Subjective:   Bruce Donath, am serving as a scribe for Dr. Antoine Primas.  I'm seeing this patient by the request  of:  Farris Has, MD  CC: Left scapular pain  [id number]  12/21/2022 Between the third and fourth, discussed the importance of a metatarsal pad and over-the-counter orthotics.  Discussed some exercises, discussed avoiding walking barefoot.  Worsening pain consider the possibility of injection.  Follow-up again in 6 to 8 weeks new problem      Update 05/25/2023 Nicole [name] is a 57 y.o. female coming in with complaint of R foot pain. Patient states that her foot is better. Painful after being seated for a while.   Complaint today is pain in L scapula. Always has a knot in apex of scapula. Sitting on desk makes it worse. Gets massage.       Past Medical History:  Diagnosis Date   GERD (gastroesophageal reflux disease)    diet controlled - no meds   Headache    Sleep apnea    mild - dental appliance at night   SVD (spontaneous vaginal delivery)    x 2   Past Surgical History:  Procedure Laterality Date   ABDOMINAL HYSTERECTOMY     COLONOSCOPY     DIAGNOSTIC LAPAROSCOPY     LAPAROSCOPIC APPENDECTOMY N/A 01/19/2019   Procedure: APPENDECTOMY LAPAROSCOPIC;  Surgeon: Harriette Bouillon, MD;  Location: [facility];  Service: General;  Laterality: N/A;   SHOULDER SURGERY Left 2006   Social History   Socioeconomic History   Marital status: Married    Spouse name: Not on file   Number of children: Not on file   Years of education: Not on file   Highest education level: Not on file  Occupational History   Not on file  Tobacco Use   Smoking status: Never   Smokeless tobacco: Never  Vaping Use   Vaping status: Never Used  Substance and Sexual Activity   Alcohol use: Yes   Drug use: Never   Sexual activity: Not on file  Other Topics Concern   Not on file   Social History Narrative   Not on file   Social Determinants of Health   Financial Resource Strain: Not on file  Food Insecurity: Not on file  Transportation Needs: Not on file  Physical Activity: Not on file  Stress: Not on file  Social Connections: Not on file   Allergies  Allergen Reactions   Bee Venom    Family History  Problem Relation Age of Onset   Breast cancer Mother 58    Current Outpatient Medications (Endocrine & Metabolic):    VIVELLE-DOT 0.075 MG/24HR, Place 1 patch onto the skin 2 (two) times a week.        Reviewed prior external information including notes and imaging from  primary care provider As well as notes that were available from care everywhere and other healthcare systems.  Past medical history, social, surgical and family history all reviewed in electronic medical record.  No pertanent information unless stated regarding to the chief complaint.   Review of Systems:  No headache, visual changes, nausea, vomiting, diarrhea, constipation, dizziness, abdominal pain, skin rash, fevers, chills, night sweats, weight loss, swollen lymph nodes, body aches, joint swelling, chest pain, shortness of breath, mood changes. POSITIVE muscle aches  Objective  Blood pressure 100/70, pulse (!) 58, height 5\' 2"  (1.575 m), weight 178 lb (80.7  kg), SpO2 98%.   General: No apparent distress alert and oriented x3 mood and affect normal, dressed appropriately.  HEENT: Pupils equal, extraocular movements intact  Respiratory: Patient's speak in full sentences and does not appear short of breath  Cardiovascular: No lower extremity edema, non tender, no erythema  But exam still shows the patient does have some breakdown of the longitudinal arch noted.  Ankle does have relatively good range of motion. Limited muscular skeletal ultrasound was performed and interpreted by Antoine Primas, M  Limited ultrasound of patient's plantar fascia does still show some mild  thickening noted but still an interval improvement from previous exam.  No true tearing appreciated.  No cortical defect of the inferior calcaneus. Impression: Interval improvement \ Osteopathic findings C4 flexed rotated and side bent left C6 flexed rotated and side bent left T9 extended rotated and side bent left inhaled rib       Impression and Recommendations:      The above documentation has been reviewed and is accurate and complete Judi Saa, DO

## 2023-05-26 ENCOUNTER — Other Ambulatory Visit: Payer: Self-pay

## 2023-05-26 ENCOUNTER — Ambulatory Visit: Payer: BC Managed Care – PPO | Admitting: Family Medicine

## 2023-05-26 ENCOUNTER — Encounter: Payer: Self-pay | Admitting: Family Medicine

## 2023-05-26 ENCOUNTER — Ambulatory Visit (INDEPENDENT_AMBULATORY_CARE_PROVIDER_SITE_OTHER): Payer: BC Managed Care – PPO

## 2023-05-26 VITALS — BP 100/70 | HR 58 | Ht 62.0 in | Wt 178.0 lb

## 2023-05-26 DIAGNOSIS — M5134 Other intervertebral disc degeneration, thoracic region: Secondary | ICD-10-CM | POA: Diagnosis not present

## 2023-05-26 DIAGNOSIS — M546 Pain in thoracic spine: Secondary | ICD-10-CM

## 2023-05-26 DIAGNOSIS — M9908 Segmental and somatic dysfunction of rib cage: Secondary | ICD-10-CM

## 2023-05-26 DIAGNOSIS — M722 Plantar fascial fibromatosis: Secondary | ICD-10-CM

## 2023-05-26 DIAGNOSIS — G2589 Other specified extrapyramidal and movement disorders: Secondary | ICD-10-CM | POA: Diagnosis not present

## 2023-05-26 DIAGNOSIS — F411 Generalized anxiety disorder: Secondary | ICD-10-CM | POA: Diagnosis not present

## 2023-05-26 DIAGNOSIS — M47814 Spondylosis without myelopathy or radiculopathy, thoracic region: Secondary | ICD-10-CM | POA: Diagnosis not present

## 2023-05-26 DIAGNOSIS — M79671 Pain in right foot: Secondary | ICD-10-CM | POA: Diagnosis not present

## 2023-05-26 NOTE — Patient Instructions (Addendum)
Xray today Scapular exercises Omega 3 instead of Omega 6 Change positoin at work every 3 hours See me in 6-8 weeks

## 2023-05-26 NOTE — Assessment & Plan Note (Signed)
Scapular dyskinesis noted.  Discussed icing regimen and home exercises, discussed which activities to do and which ones to avoid.  Increase activity slowly over the course of next several weeks.  Discussed posture noted on exam were clinically helpful.  Responded well to osteopathic manipulation.  Follow-up again in 6 to 8 weeks otherwise.

## 2023-05-26 NOTE — Assessment & Plan Note (Signed)
   Decision today to treat with OMT was based on Physical Exam  After verbal consent patient was treated with HVLA, ME, FPR techniques in cervical, thoracic, rib areas, all areas are chronic   Patient tolerated the procedure well with improvement in symptoms  Patient given exercises, stretches and lifestyle modifications  See medications in patient instructions if given  Patient will follow up in 4-8 weeks 

## 2023-05-26 NOTE — Assessment & Plan Note (Signed)
Seems to be improved.  Discussed icing regimen and home exercises, discussed which activities to do and continuing with different shoes.  Patient will try to change positions at work when possible.  Follow-up again in 6-8

## 2023-06-01 ENCOUNTER — Ambulatory Visit
Admission: RE | Admit: 2023-06-01 | Discharge: 2023-06-01 | Disposition: A | Discharge status: Home / Self Care | Payer: BC Managed Care – PPO | Source: Ambulatory Visit | Attending: Internal Medicine | Admitting: Internal Medicine

## 2023-06-01 DIAGNOSIS — F411 Generalized anxiety disorder: Secondary | ICD-10-CM | POA: Diagnosis not present

## 2023-06-01 DIAGNOSIS — D3502 Benign neoplasm of left adrenal gland: Secondary | ICD-10-CM | POA: Diagnosis not present

## 2023-06-01 MED ORDER — IOPAMIDOL (ISOVUE-300) INJECTION 61%
100.0000 mL | Freq: Once | INTRAVENOUS | Status: AC | PRN
  Administered 2023-06-01: 100 mL via INTRAVENOUS

## 2023-06-09 DIAGNOSIS — F411 Generalized anxiety disorder: Secondary | ICD-10-CM | POA: Diagnosis not present

## 2023-06-13 DIAGNOSIS — Z713 Dietary counseling and surveillance: Secondary | ICD-10-CM | POA: Diagnosis not present

## 2023-06-15 DIAGNOSIS — R41840 Attention and concentration deficit: Secondary | ICD-10-CM | POA: Diagnosis not present

## 2023-06-15 DIAGNOSIS — Z23 Encounter for immunization: Secondary | ICD-10-CM | POA: Diagnosis not present

## 2023-06-22 DIAGNOSIS — Z713 Dietary counseling and surveillance: Secondary | ICD-10-CM | POA: Diagnosis not present

## 2023-07-06 DIAGNOSIS — Z713 Dietary counseling and surveillance: Secondary | ICD-10-CM | POA: Diagnosis not present

## 2023-07-06 DIAGNOSIS — F411 Generalized anxiety disorder: Secondary | ICD-10-CM | POA: Diagnosis not present

## 2023-07-07 NOTE — Progress Notes (Signed)
[name] [address] [place] [address] Phone: [phone] Subjective:   INadine Counts, am serving as a scribe for Dr. Antoine Primas.  I'm seeing this patient by the request  of:  Farris Has, MD  CC: Right foot pain, back pain follow-up  [id number]  Nicole [name] is a 57 y.o. female coming in with complaint of back and neck pain. OMT 05/26/2023. Also f/u for R foot pain. Patient states same per usual. No new concerns. Foot doing okay.  Medications patient has been prescribed: None  Taking:         Reviewed prior external information including notes and imaging from previsou exam, outside providers and external EMR if available.   As well as notes that were available from care everywhere and other healthcare systems.  Past medical history, social, surgical and family history all reviewed in electronic medical record.  No pertanent information unless stated regarding to the chief complaint.   Past Medical History:  Diagnosis Date   GERD (gastroesophageal reflux disease)    diet controlled - no meds   Headache    Sleep apnea    mild - dental appliance at night   SVD (spontaneous vaginal delivery)    x 2    Allergies  Allergen Reactions   Bee Venom      Review of Systems:  No headache, visual changes, nausea, vomiting, diarrhea, constipation, dizziness, abdominal pain, skin rash, fevers, chills, night sweats, weight loss, swollen lymph nodes, body aches, joint swelling, chest pain, shortness of breath, mood changes. POSITIVE muscle aches  Objective  Blood pressure 112/80, pulse 68, height 5\' 2"  (1.575 m), weight 177 lb (80.3 kg), SpO2 96%.   General: No apparent distress alert and oriented x3 mood and affect normal, dressed appropriately.  HEENT: Pupils equal, extraocular movements intact  Respiratory: Patient's speak in full sentences and does not appear short of breath  Cardiovascular: No lower extremity edema,  non tender, no erythema  Gait MSK:  Back low back does have some loss lordosis noted.  Some tenderness to palpation in the paraspinal musculature.  Scapular dyskinesis still noted right greater than left.  Tenderness over the greater trochanteric areas.  Osteopathic findings  C2 flexed rotated and side bent right C7 flexed rotated and side bent left T3 extended rotated and side bent right inhaled rib T7 extended rotated and side bent left L2 flexed rotated and side bent right L4 flexed rotated and side bent left Sacrum right on right       Assessment and Plan:  Scapular dyskinesis Continue to work on scapular dyskinesis.  Continue to work on balance, core, and other potential findings as well.  Discussed which activities to do and which ones to avoid.  Increase activity slowly over the course the next several weeks.  Follow-up with me again in 6 to 8 weeks otherwise.    Nonallopathic problems  Decision today to treat with OMT was based on Physical Exam  After verbal consent patient was treated with HVLA, ME, FPR techniques in cervical, rib, thoracic, lumbar, and sacral  areas  Patient tolerated the procedure well with improvement in symptoms  Patient given exercises, stretches and lifestyle modifications  See medications in patient instructions if given  Patient will follow up in 4-8 weeks     The above documentation has been reviewed and is accurate and complete Judi Saa, DO         Note: This dictation was prepared  with Dragon dictation along with smaller phrase technology. Any transcriptional errors that result from this process are unintentional.

## 2023-07-13 ENCOUNTER — Ambulatory Visit: Payer: BC Managed Care – PPO | Admitting: Family Medicine

## 2023-07-13 ENCOUNTER — Encounter: Payer: Self-pay | Admitting: Family Medicine

## 2023-07-13 VITALS — BP 112/80 | HR 68 | Ht 62.0 in | Wt 177.0 lb

## 2023-07-13 DIAGNOSIS — M9902 Segmental and somatic dysfunction of thoracic region: Secondary | ICD-10-CM | POA: Diagnosis not present

## 2023-07-13 DIAGNOSIS — M9903 Segmental and somatic dysfunction of lumbar region: Secondary | ICD-10-CM

## 2023-07-13 DIAGNOSIS — M9904 Segmental and somatic dysfunction of sacral region: Secondary | ICD-10-CM

## 2023-07-13 DIAGNOSIS — G2589 Other specified extrapyramidal and movement disorders: Secondary | ICD-10-CM | POA: Diagnosis not present

## 2023-07-13 DIAGNOSIS — M9908 Segmental and somatic dysfunction of rib cage: Secondary | ICD-10-CM

## 2023-07-13 DIAGNOSIS — M9901 Segmental and somatic dysfunction of cervical region: Secondary | ICD-10-CM

## 2023-07-13 NOTE — Patient Instructions (Addendum)
VMO and hip abductor strength See me in 2 months

## 2023-07-13 NOTE — Assessment & Plan Note (Signed)
Continue to work on scapular dyskinesis.  Continue to work on balance, core, and other potential findings as well.  Discussed which activities to do and which ones to avoid.  Increase activity slowly over the course the next several weeks.  Follow-up with me again in 6 to 8 weeks otherwise.

## 2023-08-01 DIAGNOSIS — R7301 Impaired fasting glucose: Secondary | ICD-10-CM | POA: Diagnosis not present

## 2023-08-01 DIAGNOSIS — E785 Hyperlipidemia, unspecified: Secondary | ICD-10-CM | POA: Diagnosis not present

## 2023-08-01 DIAGNOSIS — E669 Obesity, unspecified: Secondary | ICD-10-CM | POA: Diagnosis not present

## 2023-08-09 DIAGNOSIS — Z713 Dietary counseling and surveillance: Secondary | ICD-10-CM | POA: Diagnosis not present

## 2023-08-13 DIAGNOSIS — A499 Bacterial infection, unspecified: Secondary | ICD-10-CM | POA: Diagnosis not present

## 2023-08-24 DIAGNOSIS — Z713 Dietary counseling and surveillance: Secondary | ICD-10-CM | POA: Diagnosis not present

## 2023-08-29 NOTE — Progress Notes (Signed)
[name] [address] [place] [address] Phone: [phone] Subjective:   INadine Counts, am serving as a scribe for Dr. Antoine Primas.  I'm seeing this patient by the request  of:  Farris Has, MD  CC: Upper neck and back pain follow-up  [id number]  Nicole [name] is a 57 y.o. female coming in with complaint of back and neck pain. OMT 07/13/2023. Patient states same per usual. No new concerns.  Medications patient has been prescribed: None  Taking:         Reviewed prior external information including notes and imaging from previsou exam, outside providers and external EMR if available.   As well as notes that were available from care everywhere and other healthcare systems.  Past medical history, social, surgical and family history all reviewed in electronic medical record.  No pertanent information unless stated regarding to the chief complaint.   Past Medical History:  Diagnosis Date   GERD (gastroesophageal reflux disease)    diet controlled - no meds   Headache    Sleep apnea    mild - dental appliance at night   SVD (spontaneous vaginal delivery)    x 2    Allergies  Allergen Reactions   Bee Venom      Review of Systems:  No headache, visual changes, nausea, vomiting, diarrhea, constipation, dizziness, abdominal pain, skin rash, fevers, chills, night sweats, weight loss, swollen lymph nodes, body aches, joint swelling, chest pain, shortness of breath, mood changes. POSITIVE muscle aches  Objective  Blood pressure 112/64, pulse 70, height 5\' 2"  (1.575 m), weight 176 lb (79.8 kg), SpO2 99%.   General: No apparent distress alert and oriented x3 mood and affect normal, dressed appropriately.  HEENT: Pupils equal, extraocular movements intact  Respiratory: Patient's speak in full sentences and does not appear short of breath  Cardiovascular: No lower extremity edema, non tender, no erythema  Gait MSK:  Back  does have some loss lordosis noted.  Some tenderness to palpation in the paraspinal musculature.  Does have more tightness noted in the parascapular area bilaterally right greater than left  Osteopathic findings  C2 flexed rotated and side bent right C7 flexed rotated and side bent left T3 extended rotated and side bent right inhaled rib T6 extended rotated and side bent left T9 extended rotated and side bent left L1 flexed rotated and side bent right Sacrum right on right       Assessment and Plan:  Scapular dyskinesis Mild scapular dyskinesis noted.  Discussed icing regimen and home exercises, discussed which activities to do and which ones to avoid.  Did have some increasing stress and tenderness recently.  Follow-up with me again in 6 to 8 weeks    Nonallopathic problems  Decision today to treat with OMT was based on Physical Exam  After verbal consent patient was treated with HVLA, ME, FPR techniques in cervical, rib, thoracic, lumbar, and sacral  areas  Patient tolerated the procedure well with improvement in symptoms  Patient given exercises, stretches and lifestyle modifications  See medications in patient instructions if given  Patient will follow up in 6-8 weeks    The above documentation has been reviewed and is accurate and complete Judi Saa, DO          Note: This dictation was prepared with Dragon dictation along with smaller phrase technology. Any transcriptional errors that result from this process are unintentional.

## 2023-08-30 ENCOUNTER — Encounter: Payer: Self-pay | Admitting: Family Medicine

## 2023-08-30 ENCOUNTER — Ambulatory Visit: Payer: BC Managed Care – PPO | Admitting: Family Medicine

## 2023-08-30 VITALS — BP 112/64 | HR 70 | Ht 62.0 in | Wt 176.0 lb

## 2023-08-30 DIAGNOSIS — M9908 Segmental and somatic dysfunction of rib cage: Secondary | ICD-10-CM | POA: Diagnosis not present

## 2023-08-30 DIAGNOSIS — M9903 Segmental and somatic dysfunction of lumbar region: Secondary | ICD-10-CM

## 2023-08-30 DIAGNOSIS — M9901 Segmental and somatic dysfunction of cervical region: Secondary | ICD-10-CM | POA: Diagnosis not present

## 2023-08-30 DIAGNOSIS — G2589 Other specified extrapyramidal and movement disorders: Secondary | ICD-10-CM | POA: Diagnosis not present

## 2023-08-30 DIAGNOSIS — M9902 Segmental and somatic dysfunction of thoracic region: Secondary | ICD-10-CM | POA: Diagnosis not present

## 2023-08-30 DIAGNOSIS — M9904 Segmental and somatic dysfunction of sacral region: Secondary | ICD-10-CM | POA: Diagnosis not present

## 2023-08-30 NOTE — Assessment & Plan Note (Signed)
Mild scapular dyskinesis noted.  Discussed icing regimen and home exercises, discussed which activities to do and which ones to avoid.  Did have some increasing stress and tenderness recently.  Follow-up with me again in 6 to 8 weeks

## 2023-08-30 NOTE — Patient Instructions (Signed)
Ultra shoes or [employer] minimus Happy Holidays See you again in 2 months

## 2023-09-19 DIAGNOSIS — R41840 Attention and concentration deficit: Secondary | ICD-10-CM | POA: Diagnosis not present

## 2023-09-22 DIAGNOSIS — F338 Other recurrent depressive disorders: Secondary | ICD-10-CM | POA: Diagnosis not present

## 2023-09-22 DIAGNOSIS — R41840 Attention and concentration deficit: Secondary | ICD-10-CM | POA: Diagnosis not present

## 2023-09-22 DIAGNOSIS — F419 Anxiety disorder, unspecified: Secondary | ICD-10-CM | POA: Diagnosis not present

## 2023-09-26 DIAGNOSIS — Z713 Dietary counseling and surveillance: Secondary | ICD-10-CM | POA: Diagnosis not present

## 2023-10-04 ENCOUNTER — Other Ambulatory Visit: Payer: Self-pay | Admitting: Family Medicine

## 2023-10-04 DIAGNOSIS — Z1231 Encounter for screening mammogram for malignant neoplasm of breast: Secondary | ICD-10-CM

## 2023-10-07 DIAGNOSIS — L304 Erythema intertrigo: Secondary | ICD-10-CM | POA: Diagnosis not present

## 2023-10-07 DIAGNOSIS — L920 Granuloma annulare: Secondary | ICD-10-CM | POA: Diagnosis not present

## 2023-10-07 DIAGNOSIS — D692 Other nonthrombocytopenic purpura: Secondary | ICD-10-CM | POA: Diagnosis not present

## 2023-10-07 DIAGNOSIS — L814 Other melanin hyperpigmentation: Secondary | ICD-10-CM | POA: Diagnosis not present

## 2023-10-20 DIAGNOSIS — Z01419 Encounter for gynecological examination (general) (routine) without abnormal findings: Secondary | ICD-10-CM | POA: Diagnosis not present

## 2023-10-20 DIAGNOSIS — N393 Stress incontinence (female) (male): Secondary | ICD-10-CM | POA: Diagnosis not present

## 2023-10-20 DIAGNOSIS — Z6832 Body mass index (BMI) 32.0-32.9, adult: Secondary | ICD-10-CM | POA: Diagnosis not present

## 2023-10-25 DIAGNOSIS — Z713 Dietary counseling and surveillance: Secondary | ICD-10-CM | POA: Diagnosis not present

## 2023-10-25 NOTE — Progress Notes (Unsigned)
[name] [address] [place] [address] Phone: [phone] Subjective:   Morene Antu am a scribe for Dr. Katrinka Blazing.   I'm seeing this patient by the request  of:  Farris Has, MD  CC: back and neck pain follow up   [id number]  Nicole [name] is a 58 y.o. female coming in with complaint of back and neck pain. OMT 08/30/2023. Patient states is ok. Nothing new to report. It is the usual pain.   Medications patient has been prescribed: None  Taking:         Reviewed prior external information including notes and imaging from previsou exam, outside providers and external EMR if available.   As well as notes that were available from care everywhere and other healthcare systems.  Past medical history, social, surgical and family history all reviewed in electronic medical record.  No pertanent information unless stated regarding to the chief complaint.   Past Medical History:  Diagnosis Date   GERD (gastroesophageal reflux disease)    diet controlled - no meds   Headache    Sleep apnea    mild - dental appliance at night   SVD (spontaneous vaginal delivery)    x 2    Allergies  Allergen Reactions   Bee Venom      Review of Systems:  No headache, visual changes, nausea, vomiting, diarrhea, constipation, dizziness, abdominal pain, skin rash, fevers, chills, night sweats, weight loss, swollen lymph nodes, body aches, joint swelling, chest pain, shortness of breath, mood changes. POSITIVE muscle aches  Objective  Blood pressure 120/70, pulse 60, height 5\' 2"  (1.575 m), weight 173 lb 9.6 oz (78.7 kg), SpO2 96%.   General: No apparent distress alert and oriented x3 mood and affect normal, dressed appropriately.  HEENT: Pupils equal, extraocular movements intact  Respiratory: Patient's speak in full sentences and does not appear short of breath  Cardiovascular: No lower extremity edema, non tender, no erythema  Gait MSK:   Back does have tightness noted.  Some tenderness to palpation in the paraspinal musculature.  Scapular dyskinesis noted.  Hip exam shows some tenderness over the greater trochanteric area. Left wrist exam shows a patient does have some tenderness more on the palmar aspect of the wrist mostly in the third compartment.  Good grip strength noted.  Negative Tinel's noted today.  Osteopathic findings  C2 flexed rotated and side bent right C6 flexed rotated and side bent left T3 extended rotated and side bent right inhaled rib T9 extended rotated and side bent left L2 flexed rotated and side bent right Sacrum right on right       Assessment and Plan:  Scapular dyskinesis Scapular dyskinesis noted.  Discussed which activities to do and which ones to avoid.  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  Patient wants to hold off at this time if possible.  Follow-up again in 6 to 8 weeks  Left wrist pain Left wrist exam has what appears to be more of a tendinitis.  We discussed which activities to do and which ones to avoid.  We discussed potential bracing with repetitive activity.  At follow-up if continuing to have pain consider ultrasound    Nonallopathic problems  Decision today to treat with OMT was based on Physical Exam  After verbal consent patient was treated with HVLA, ME, FPR techniques in cervical, rib, thoracic, lumbar, and sacral  areas  Patient tolerated the procedure well with improvement in symptoms  Patient given exercises, stretches and lifestyle modifications  See medications in patient instructions if given  Patient will follow up in 4-8 weeks     The above documentation has been reviewed and is accurate and complete Judi Saa, DO         Note: This dictation was prepared with Dragon dictation along with smaller phrase technology. Any transcriptional errors that result from  this process are unintentional.

## 2023-10-27 ENCOUNTER — Encounter: Payer: Self-pay | Admitting: Family Medicine

## 2023-10-27 ENCOUNTER — Ambulatory Visit: Payer: BC Managed Care – PPO | Admitting: Family Medicine

## 2023-10-27 VITALS — BP 120/70 | HR 60 | Ht 62.0 in | Wt 173.6 lb

## 2023-10-27 DIAGNOSIS — M9908 Segmental and somatic dysfunction of rib cage: Secondary | ICD-10-CM

## 2023-10-27 DIAGNOSIS — M9901 Segmental and somatic dysfunction of cervical region: Secondary | ICD-10-CM

## 2023-10-27 DIAGNOSIS — G2589 Other specified extrapyramidal and movement disorders: Secondary | ICD-10-CM | POA: Diagnosis not present

## 2023-10-27 DIAGNOSIS — M25532 Pain in left wrist: Secondary | ICD-10-CM

## 2023-10-27 DIAGNOSIS — M9903 Segmental and somatic dysfunction of lumbar region: Secondary | ICD-10-CM | POA: Diagnosis not present

## 2023-10-27 DIAGNOSIS — M9902 Segmental and somatic dysfunction of thoracic region: Secondary | ICD-10-CM

## 2023-10-27 DIAGNOSIS — M9904 Segmental and somatic dysfunction of sacral region: Secondary | ICD-10-CM | POA: Diagnosis not present

## 2023-10-27 NOTE — Assessment & Plan Note (Signed)
Left wrist exam has what appears to be more of a tendinitis.  We discussed which activities to do and which ones to avoid.  We discussed potential bracing with repetitive activity.  At follow-up if continuing to have pain consider ultrasound

## 2023-10-27 NOTE — Patient Instructions (Signed)
Co-band. Will keep an eye on the hip. Ice and Voltaren. See me again in 6 to 8 weeks probably with hubby.

## 2023-10-27 NOTE — Assessment & Plan Note (Signed)
Scapular dyskinesis noted.  Discussed which activities to do and which ones to avoid.  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  Patient wants to hold off at this time if possible.  Follow-up again in 6 to 8 weeks

## 2023-11-01 ENCOUNTER — Ambulatory Visit: Payer: BC Managed Care – PPO | Admitting: Family Medicine

## 2023-11-01 ENCOUNTER — Ambulatory Visit
Admission: RE | Admit: 2023-11-01 | Discharge: 2023-11-01 | Discharge status: Home / Self Care | Payer: BC Managed Care – PPO | Source: Ambulatory Visit

## 2023-11-01 DIAGNOSIS — Z1231 Encounter for screening mammogram for malignant neoplasm of breast: Secondary | ICD-10-CM

## 2023-11-09 DIAGNOSIS — Z713 Dietary counseling and surveillance: Secondary | ICD-10-CM | POA: Diagnosis not present

## 2023-11-10 IMAGING — MG MM DIGITAL SCREENING BILAT W/ TOMO AND CAD
8 series · 8 of 24 positions shown · non-contrast
Comparison: Previous exam(s).

CLINICAL DATA: Screening.

EXAM:
DIGITAL SCREENING BILATERAL MAMMOGRAM WITH TOMOSYNTHESIS AND CAD
TECHNIQUE: Bilateral screening digital craniocaudal and mediolateral oblique
mammograms were obtained. Bilateral screening digital breast
tomosynthesis was performed. The images were evaluated with
computer-aided detection.

[L CC synth-2D]
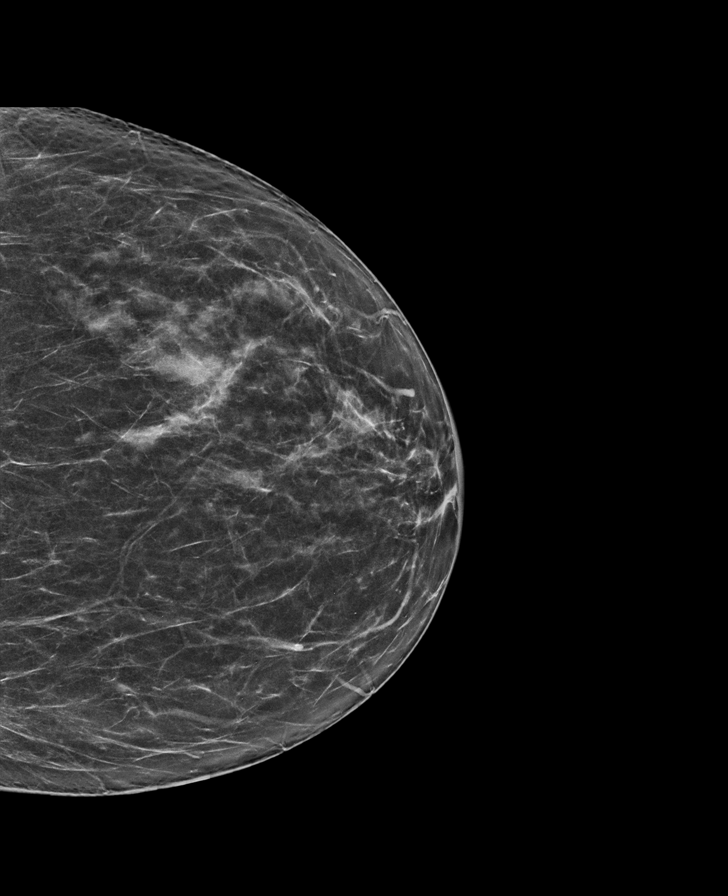

[L MLO synth-2D]
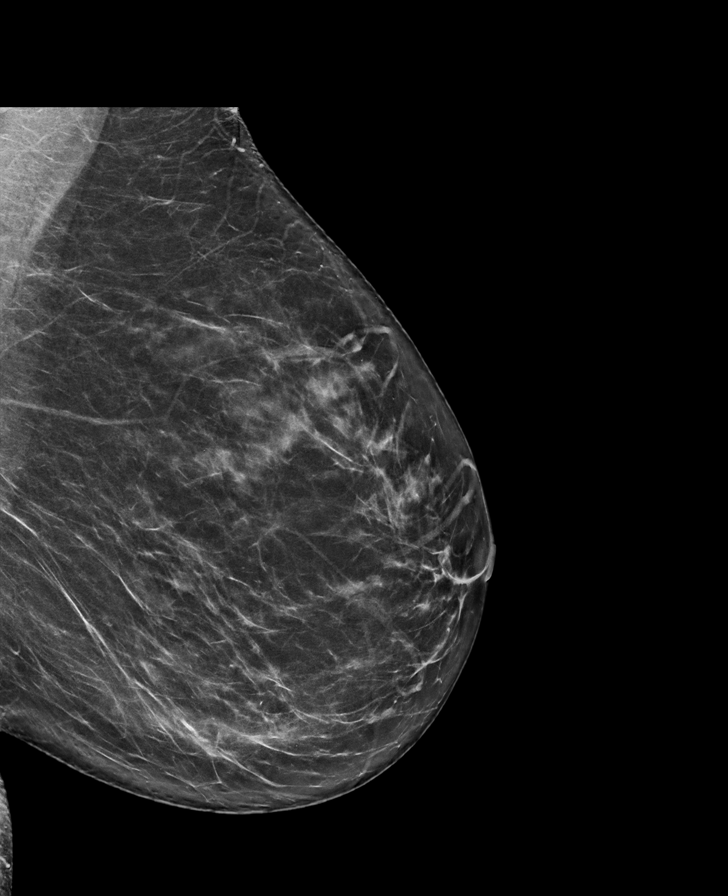

[R CC synth-2D]
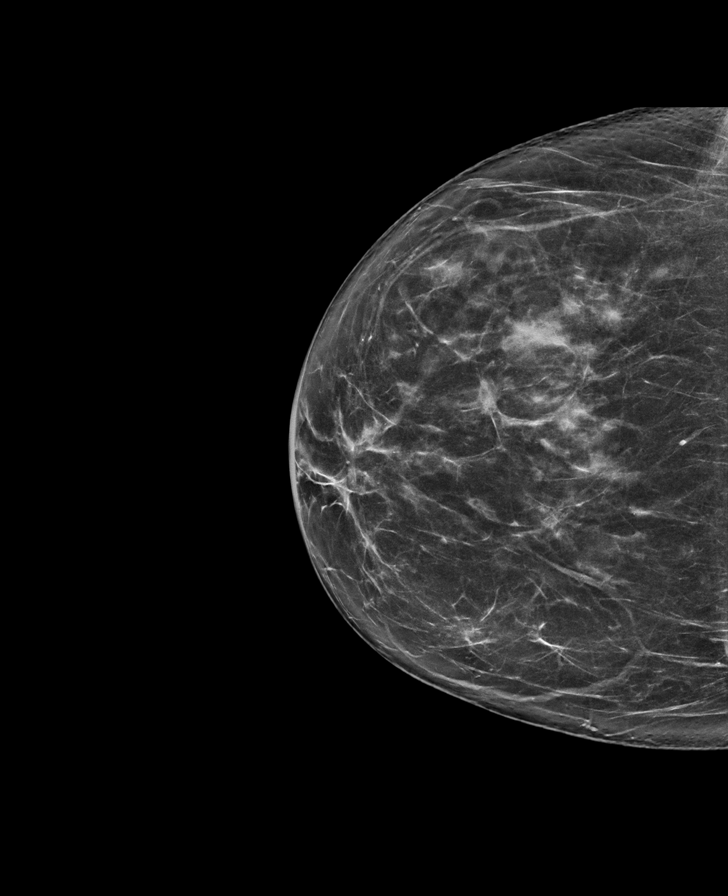

[R MLO synth-2D]
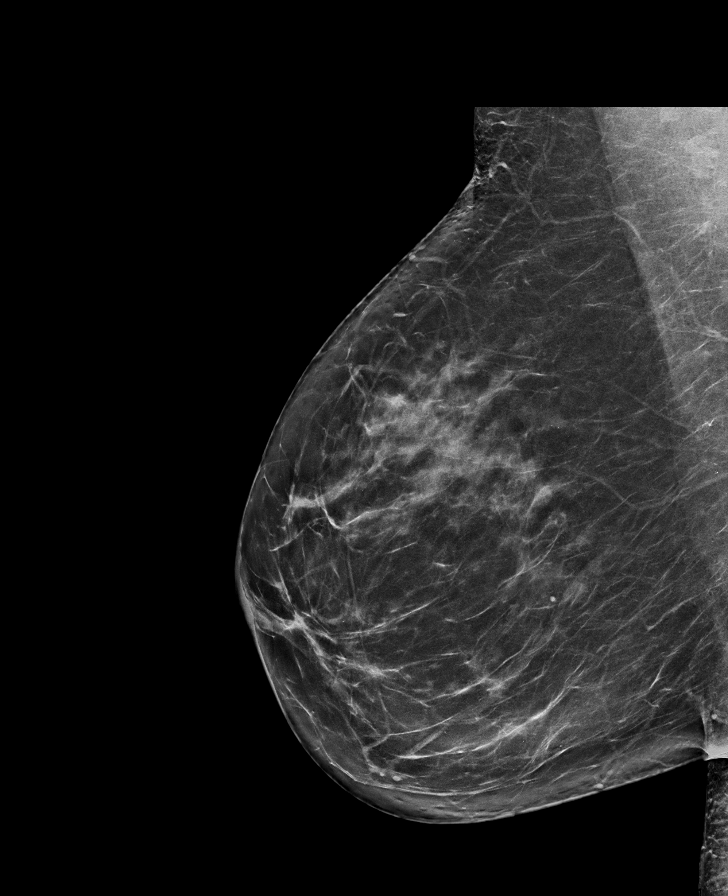

[R CC tomo · tomo slice 38/75.0]
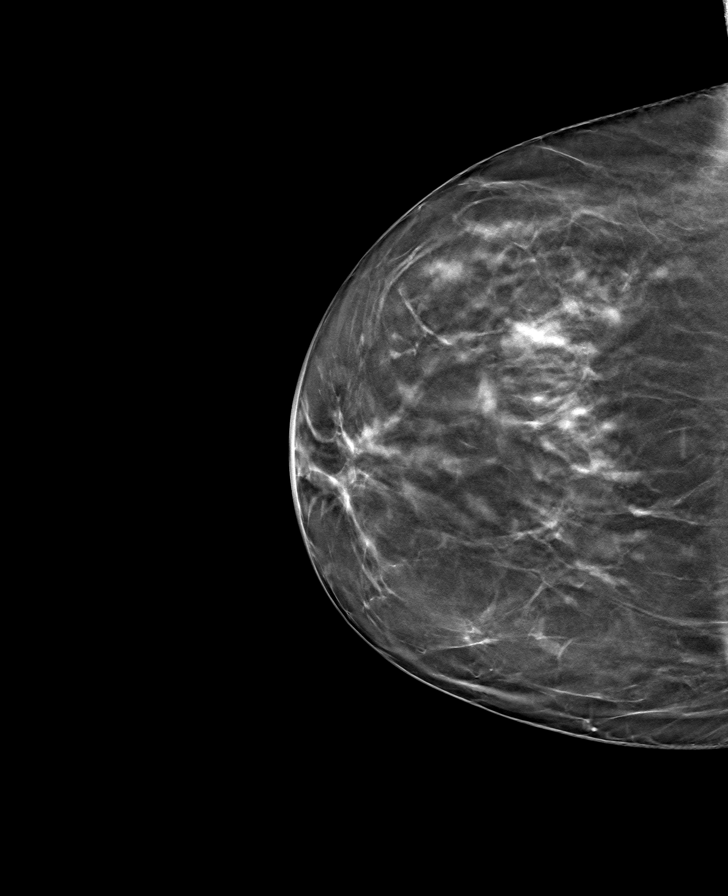

[R MLO tomo · tomo slice 39/78.0]
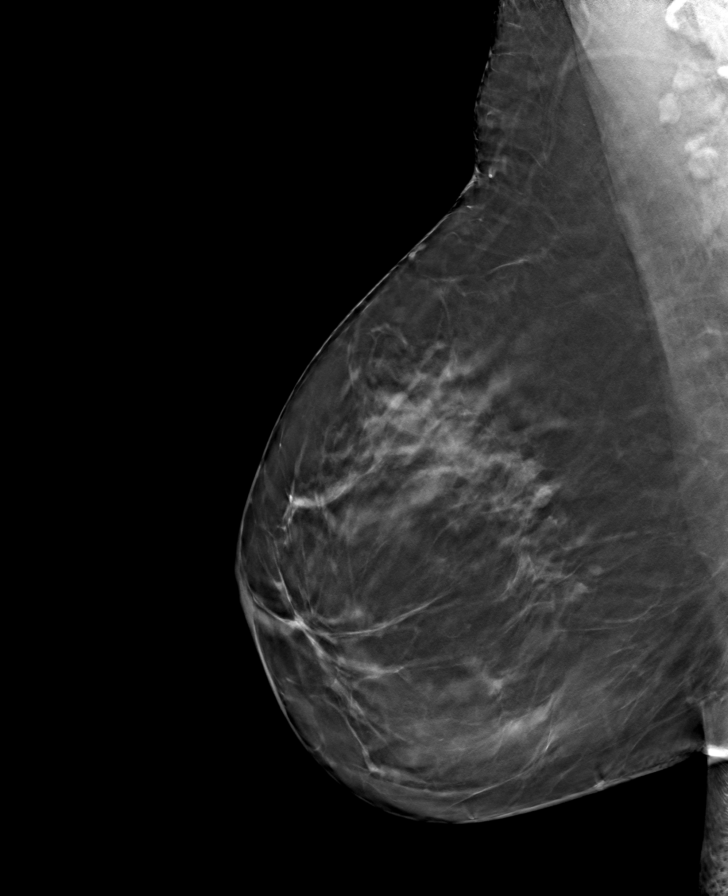

[L MLO tomo · tomo slice 39/76.0]
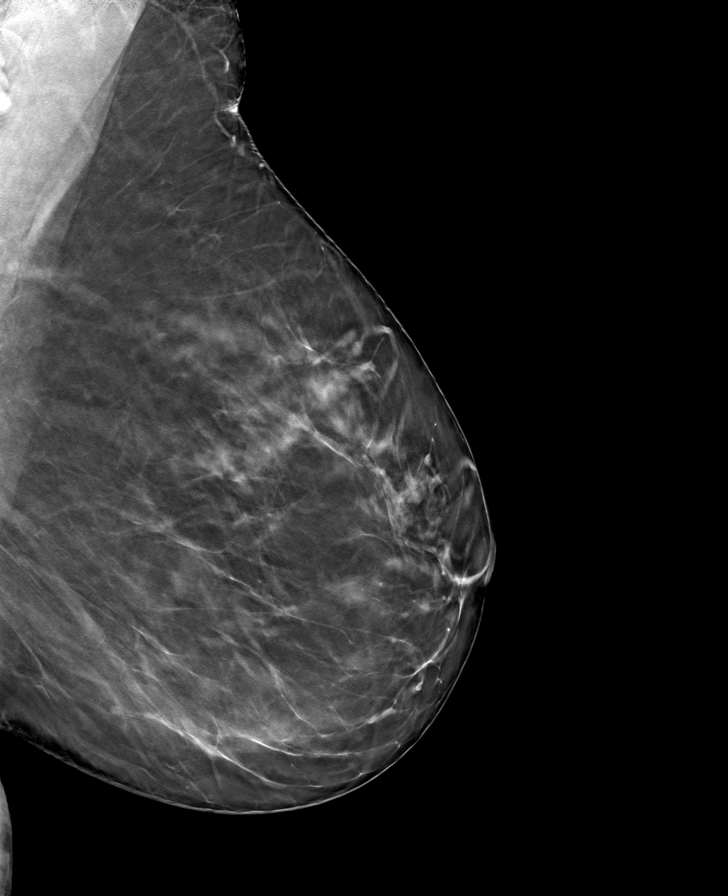

[L CC tomo · tomo slice 35/70.0]
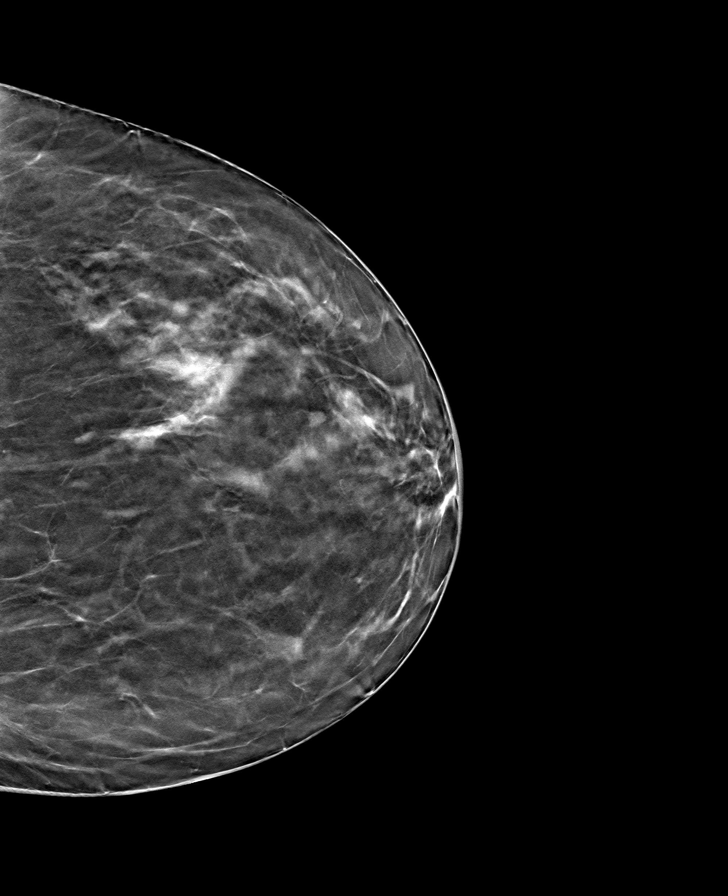

[8 of 24 positions shown; findings below may reference images not displayed]

ACR Breast Density Category b: There are scattered areas of
fibroglandular density.
FINDINGS: There are no findings suspicious for malignancy.
IMPRESSION: No mammographic evidence of malignancy. A result letter of this
screening mammogram will be mailed directly to the patient.

RECOMMENDATION:
Screening mammogram in one year. (Code:51-O-LD2)

BI-RADS CATEGORY  1: Negative.

## 2023-12-16 ENCOUNTER — Ambulatory Visit: Payer: BC Managed Care – PPO | Admitting: Family Medicine

## 2023-12-19 NOTE — Progress Notes (Unsigned)
 [name] [address] [place] [address] Phone: [phone] Subjective:   Nicole [name] am a scribe for Dr. Katrinka Blazing.    I'm seeing this patient by the request  of:  Farris Has, MD  CC: Back and neck pain follow-up, right knee pain  [id number]  Taneah Masri is a 58 y.o. female coming in with complaint of back and neck pain. OMT 10/27/2023. Also R knee pain. Patient states knee is about the same. Back is ready to be adjusted. In the middle of moving so everything is hurting.   Medications patient has been prescribed: None          Reviewed prior external information including notes and imaging from previsou exam, outside providers and external EMR if available.   As well as notes that were available from care everywhere and other healthcare systems.  Past medical history, social, surgical and family history all reviewed in electronic medical record.  No pertanent information unless stated regarding to the chief complaint.   Past Medical History:  Diagnosis Date   GERD (gastroesophageal reflux disease)    diet controlled - no meds   Headache    Sleep apnea    mild - dental appliance at night   SVD (spontaneous vaginal delivery)    x 2    Allergies  Allergen Reactions   Bee Venom      Review of Systems:  No headache, visual changes, nausea, vomiting, diarrhea, constipation, dizziness, abdominal pain, skin rash, fevers, chills, night sweats, weight loss, swollen lymph nodes, body aches, joint swelling, chest pain, shortness of breath, mood changes. POSITIVE muscle aches  Objective  Blood pressure 118/60, pulse 69, height 5\' 2"  (1.575 m), weight 171 lb (77.6 kg), SpO2 97%.   General: No apparent distress alert and oriented x3 mood and affect normal, dressed appropriately.  HEENT: Pupils equal, extraocular movements intact  Respiratory: Patient's speak in full sentences and does not appear short of breath   Cardiovascular: No lower extremity edema, non tender, no erythema  Right knee does have some tenderness to palpation over the medial joint line.  No crepitus noted.  No patella alta noted.  Lateral tracking noted. Significant tightness noted in the left scapular area.  Multiple trigger points noted.  Patient does have some crepitus with certain movement.  Osteopathic findings  C3 flexed rotated and side bent right C6 flexed rotated and side bent right T3 extended rotated and side bent left inhaled rib T7 extended rotated and side bent left inhaled rib L2 flexed rotated and side bent right Sacrum right on right       Assessment and Plan:  Scapular dyskinesis Scapular dyskinesis still noted.  Does respond well to osteopathic manipulation.  Discussed which activities to do and which ones to avoid.  Increase activity slowly over the course of next several weeks.  Follow-up again in 6 to 8 weeks otherwise.  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  Will get x-rays ordered today.  Worsening pain injection patient    Nonallopathic problems  Decision today to treat with OMT was based on Physical Exam  After verbal consent patient was treated with HVLA, ME, FPR techniques in cervical, rib, thoracic, lumbar, and sacral  areas  Patient tolerated the procedure well with improvement in symptoms  Patient given exercises, stretches and lifestyle modifications  See medications in patient instructions if given  Patient will follow up in 4-8 weeks     The above  documentation has been reviewed and is accurate and complete Judi Saa, DO         Note: This dictation was prepared with Dragon dictation along with smaller phrase technology. Any transcriptional errors that result from this process are unintentional.

## 2023-12-20 ENCOUNTER — Encounter: Payer: Self-pay | Admitting: Family Medicine

## 2023-12-20 ENCOUNTER — Ambulatory Visit: Payer: BC Managed Care – PPO | Admitting: Family Medicine

## 2023-12-20 ENCOUNTER — Ambulatory Visit (INDEPENDENT_AMBULATORY_CARE_PROVIDER_SITE_OTHER)

## 2023-12-20 VITALS — BP 118/60 | HR 69 | Ht 62.0 in | Wt 171.0 lb

## 2023-12-20 DIAGNOSIS — M9901 Segmental and somatic dysfunction of cervical region: Secondary | ICD-10-CM

## 2023-12-20 DIAGNOSIS — M9904 Segmental and somatic dysfunction of sacral region: Secondary | ICD-10-CM

## 2023-12-20 DIAGNOSIS — G2589 Other specified extrapyramidal and movement disorders: Secondary | ICD-10-CM | POA: Diagnosis not present

## 2023-12-20 DIAGNOSIS — M9902 Segmental and somatic dysfunction of thoracic region: Secondary | ICD-10-CM

## 2023-12-20 DIAGNOSIS — M2241 Chondromalacia patellae, right knee: Secondary | ICD-10-CM

## 2023-12-20 DIAGNOSIS — M25561 Pain in right knee: Secondary | ICD-10-CM | POA: Diagnosis not present

## 2023-12-20 DIAGNOSIS — M2550 Pain in unspecified joint: Secondary | ICD-10-CM | POA: Diagnosis not present

## 2023-12-20 DIAGNOSIS — G8929 Other chronic pain: Secondary | ICD-10-CM | POA: Diagnosis not present

## 2023-12-20 DIAGNOSIS — M9903 Segmental and somatic dysfunction of lumbar region: Secondary | ICD-10-CM | POA: Diagnosis not present

## 2023-12-20 DIAGNOSIS — M9908 Segmental and somatic dysfunction of rib cage: Secondary | ICD-10-CM

## 2023-12-20 DIAGNOSIS — M549 Dorsalgia, unspecified: Secondary | ICD-10-CM | POA: Diagnosis not present

## 2023-12-20 DIAGNOSIS — M546 Pain in thoracic spine: Secondary | ICD-10-CM | POA: Diagnosis not present

## 2023-12-20 MED ORDER — GABAPENTIN 100 MG PO CAPS: 200.0000 mg | ORAL_CAPSULE | Freq: Every day | ORAL | 0 refills | Status: AC

## 2023-12-20 MED ORDER — METHYLPREDNISOLONE ACETATE 80 MG/ML IJ SUSP
80.0000 mg | Freq: Once | INTRAMUSCULAR | Status: AC
  Administered 2023-12-20: 80 mg via INTRAMUSCULAR

## 2023-12-20 MED ORDER — KETOROLAC TROMETHAMINE 60 MG/2ML IM SOLN
60.0000 mg | Freq: Once | INTRAMUSCULAR | Status: AC
  Administered 2023-12-20: 60 mg via INTRAMUSCULAR

## 2023-12-20 NOTE — Assessment & Plan Note (Signed)
 Scapular dyskinesis still noted.  Does respond well to osteopathic manipulation.  Discussed which activities to do and which ones to avoid.  Increase activity slowly over the course of next several weeks.  Follow-up again in 6 to 8 weeks otherwise.

## 2023-12-20 NOTE — Assessment & Plan Note (Addendum)
 On the right side today.  Will get x-rays ordered today.  Worsening pain injection patient

## 2023-12-20 NOTE — Patient Instructions (Addendum)
 Good to see you.  Xray today lumbar spine and R knee Start Gabapentin.  See you back in 6 weeks.

## 2024-01-26 ENCOUNTER — Ambulatory Visit: Admitting: Family Medicine

## 2024-01-27 ENCOUNTER — Ambulatory Visit: Admitting: Family Medicine

## 2024-02-14 ENCOUNTER — Ambulatory Visit: Admitting: Family Medicine

## 2024-03-14 ENCOUNTER — Ambulatory Visit: Admitting: Family Medicine

## 2024-04-19 DIAGNOSIS — Z6831 Body mass index (BMI) 31.0-31.9, adult: Secondary | ICD-10-CM | POA: Diagnosis not present

## 2024-04-19 DIAGNOSIS — E559 Vitamin D deficiency, unspecified: Secondary | ICD-10-CM | POA: Diagnosis not present

## 2024-04-19 DIAGNOSIS — G4700 Insomnia, unspecified: Secondary | ICD-10-CM | POA: Diagnosis not present

## 2024-04-19 DIAGNOSIS — E785 Hyperlipidemia, unspecified: Secondary | ICD-10-CM | POA: Diagnosis not present

## 2024-04-19 DIAGNOSIS — Z0000 Encounter for general adult medical examination without abnormal findings: Secondary | ICD-10-CM | POA: Diagnosis not present

## 2024-04-19 DIAGNOSIS — R7301 Impaired fasting glucose: Secondary | ICD-10-CM | POA: Diagnosis not present

## 2024-04-19 DIAGNOSIS — Z23 Encounter for immunization: Secondary | ICD-10-CM | POA: Diagnosis not present

## 2024-04-20 ENCOUNTER — Ambulatory Visit: Admitting: Family Medicine

## 2024-04-24 ENCOUNTER — Other Ambulatory Visit (HOSPITAL_BASED_OUTPATIENT_CLINIC_OR_DEPARTMENT_OTHER): Payer: Self-pay | Admitting: Family Medicine

## 2024-04-24 DIAGNOSIS — E785 Hyperlipidemia, unspecified: Secondary | ICD-10-CM

## 2024-05-04 NOTE — Progress Notes (Deleted)
  [facility] [address] [place] [address] Phone: [phone] Subjective:    I'm seeing this patient by the request  of:  Kip Righter, MD  CC:   YEP:Dlagzrupcz  Nicole [name] is a 58 y.o. female coming in with complaint of back and neck pain. OMT on 12/20/2023. Patient states   Medications patient has been prescribed: gabapentin   Taking:         Reviewed prior external information including notes and imaging from previsou exam, outside providers and external EMR if available.   As well as notes that were available from care everywhere and other healthcare systems.  Past medical history, social, surgical and family history all reviewed in electronic medical record.  No pertanent information unless stated regarding to the chief complaint.   Past Medical History:  Diagnosis Date   GERD (gastroesophageal reflux disease)    diet controlled - no meds   Headache    Sleep apnea    mild - dental appliance at night   SVD (spontaneous vaginal delivery)    x 2    Allergies  Allergen Reactions   Bee Venom      Review of Systems:  No headache, visual changes, nausea, vomiting, diarrhea, constipation, dizziness, abdominal pain, skin rash, fevers, chills, night sweats, weight loss, swollen lymph nodes, body aches, joint swelling, chest pain, shortness of breath, mood changes. POSITIVE muscle aches  Objective  There were no vitals taken for this visit.   General: No apparent distress alert and oriented x3 mood and affect normal, dressed appropriately.  HEENT: Pupils equal, extraocular movements intact  Respiratory: Patient's speak in full sentences and does not appear short of breath  Cardiovascular: No lower extremity edema, non tender, no erythema  Gait MSK:  Back   Osteopathic findings  C2 flexed rotated and side bent right C6 flexed rotated and side bent left T3 extended rotated and side bent right inhaled rib T9 extended  rotated and side bent left L2 flexed rotated and side bent right Sacrum right on right       Assessment and Plan:  No problem-specific Assessment & Plan notes found for this encounter.    Nonallopathic problems  Decision today to treat with OMT was based on Physical Exam  After verbal consent patient was treated with HVLA, ME, FPR techniques in cervical, rib, thoracic, lumbar, and sacral  areas  Patient tolerated the procedure well with improvement in symptoms  Patient given exercises, stretches and lifestyle modifications  See medications in patient instructions if given  Patient will follow up in 4-8 weeks             Note: This dictation was prepared with Dragon dictation along with smaller phrase technology. Any transcriptional errors that result from this process are unintentional.

## 2024-05-09 ENCOUNTER — Ambulatory Visit: Admitting: Family Medicine

## 2024-05-16 NOTE — Progress Notes (Unsigned)
  [facility] [address] [place] [address] Phone: [phone] Subjective:    I'm seeing this patient by the request  of:  Kip Righter, MD  CC:   YEP:Dlagzrupcz  Nicole [name] is a 58 y.o. female coming in with complaint of back and neck pain. OMT on 12/20/2023. Patient states   Medications patient has been prescribed: gabapentin   Taking:         Reviewed prior external information including notes and imaging from previsou exam, outside providers and external EMR if available.   As well as notes that were available from care everywhere and other healthcare systems.  Past medical history, social, surgical and family history all reviewed in electronic medical record.  No pertanent information unless stated regarding to the chief complaint.   Past Medical History:  Diagnosis Date   GERD (gastroesophageal reflux disease)    diet controlled - no meds   Headache    Sleep apnea    mild - dental appliance at night   SVD (spontaneous vaginal delivery)    x 2    Allergies  Allergen Reactions   Bee Venom      Review of Systems:  No headache, visual changes, nausea, vomiting, diarrhea, constipation, dizziness, abdominal pain, skin rash, fevers, chills, night sweats, weight loss, swollen lymph nodes, body aches, joint swelling, chest pain, shortness of breath, mood changes. POSITIVE muscle aches  Objective  There were no vitals taken for this visit.   General: No apparent distress alert and oriented x3 mood and affect normal, dressed appropriately.  HEENT: Pupils equal, extraocular movements intact  Respiratory: Patient's speak in full sentences and does not appear short of breath  Cardiovascular: No lower extremity edema, non tender, no erythema  Gait MSK:  Back   Osteopathic findings  C2 flexed rotated and side bent right C6 flexed rotated and side bent left T3 extended rotated and side bent right inhaled rib T9 extended  rotated and side bent left L2 flexed rotated and side bent right Sacrum right on right       Assessment and Plan:  No problem-specific Assessment & Plan notes found for this encounter.    Nonallopathic problems  Decision today to treat with OMT was based on Physical Exam  After verbal consent patient was treated with HVLA, ME, FPR techniques in cervical, rib, thoracic, lumbar, and sacral  areas  Patient tolerated the procedure well with improvement in symptoms  Patient given exercises, stretches and lifestyle modifications  See medications in patient instructions if given  Patient will follow up in 4-8 weeks             Note: This dictation was prepared with Dragon dictation along with smaller phrase technology. Any transcriptional errors that result from this process are unintentional.

## 2024-05-17 ENCOUNTER — Ambulatory Visit: Admitting: Family Medicine

## 2024-05-17 ENCOUNTER — Encounter: Payer: Self-pay | Admitting: Family Medicine

## 2024-05-17 VITALS — HR 57 | Ht 62.0 in | Wt 172.0 lb

## 2024-05-17 DIAGNOSIS — G2589 Other specified extrapyramidal and movement disorders: Secondary | ICD-10-CM | POA: Diagnosis not present

## 2024-05-17 DIAGNOSIS — M7062 Trochanteric bursitis, left hip: Secondary | ICD-10-CM | POA: Diagnosis not present

## 2024-05-17 DIAGNOSIS — M9902 Segmental and somatic dysfunction of thoracic region: Secondary | ICD-10-CM

## 2024-05-17 DIAGNOSIS — M7061 Trochanteric bursitis, right hip: Secondary | ICD-10-CM

## 2024-05-17 DIAGNOSIS — M9903 Segmental and somatic dysfunction of lumbar region: Secondary | ICD-10-CM

## 2024-05-17 DIAGNOSIS — M9908 Segmental and somatic dysfunction of rib cage: Secondary | ICD-10-CM | POA: Diagnosis not present

## 2024-05-17 DIAGNOSIS — M9904 Segmental and somatic dysfunction of sacral region: Secondary | ICD-10-CM | POA: Diagnosis not present

## 2024-05-17 DIAGNOSIS — M9901 Segmental and somatic dysfunction of cervical region: Secondary | ICD-10-CM

## 2024-05-17 MED ORDER — DULOXETINE HCL 20 MG PO CPEP
20.0000 mg | ORAL_CAPSULE | Freq: Every day | ORAL | 0 refills | Status: AC
Start: 2024-05-17 — End: ?

## 2024-05-17 NOTE — Patient Instructions (Signed)
 Random insulin level Consider adding cinnamon See me in 7-8 weeks

## 2024-05-17 NOTE — Assessment & Plan Note (Signed)
 Continue to monitor having worsening pain again.

## 2024-05-17 NOTE — Assessment & Plan Note (Signed)
 Discussed posture and ergonomics.  Discussed which activities to do and which ones to avoid.  Increase activity slowly.  Follow-up again in 6 to 8 weeks.  Patient will use standing desk regular basis.  Discussed different medications and prescribed Cymbalta  20 mg.  Warned of potential side effects.

## 2024-05-18 DIAGNOSIS — Z7984 Long term (current) use of oral hypoglycemic drugs: Secondary | ICD-10-CM | POA: Diagnosis not present

## 2024-05-18 DIAGNOSIS — R7303 Prediabetes: Secondary | ICD-10-CM | POA: Diagnosis not present

## 2024-05-18 DIAGNOSIS — D3502 Benign neoplasm of left adrenal gland: Secondary | ICD-10-CM | POA: Diagnosis not present

## 2024-05-18 DIAGNOSIS — Z8632 Personal history of gestational diabetes: Secondary | ICD-10-CM | POA: Diagnosis not present

## 2024-05-31 ENCOUNTER — Ambulatory Visit: Admitting: Family Medicine

## 2024-07-05 ENCOUNTER — Ambulatory Visit: Admitting: Family Medicine

## 2024-08-21 DIAGNOSIS — R110 Nausea: Secondary | ICD-10-CM | POA: Diagnosis not present

## 2024-08-21 DIAGNOSIS — K219 Gastro-esophageal reflux disease without esophagitis: Secondary | ICD-10-CM | POA: Diagnosis not present

## 2024-08-21 DIAGNOSIS — R198 Other specified symptoms and signs involving the digestive system and abdomen: Secondary | ICD-10-CM | POA: Diagnosis not present

## 2024-08-21 DIAGNOSIS — Z860100 Personal history of colon polyps, unspecified: Secondary | ICD-10-CM | POA: Diagnosis not present

## 2024-08-22 ENCOUNTER — Ambulatory Visit: Admitting: Family Medicine

## 2024-10-02 ENCOUNTER — Other Ambulatory Visit: Payer: Self-pay | Admitting: Obstetrics and Gynecology

## 2024-10-02 DIAGNOSIS — Z1231 Encounter for screening mammogram for malignant neoplasm of breast: Secondary | ICD-10-CM

## 2024-11-01 ENCOUNTER — Ambulatory Visit

## 2024-11-01 DIAGNOSIS — Z1231 Encounter for screening mammogram for malignant neoplasm of breast: Secondary | ICD-10-CM
